# Patient Record
Sex: Male | Born: 1975 | Race: White | Hispanic: No | Marital: Married | Smoking: Current every day smoker
Health system: Southern US, Community
[De-identification: ages and names within clinical notes are randomized; demographics above are authoritative.]

## PROBLEM LIST (undated history)

## (undated) DIAGNOSIS — I10 Essential (primary) hypertension: Secondary | ICD-10-CM

## (undated) DIAGNOSIS — Z8719 Personal history of other diseases of the digestive system: Secondary | ICD-10-CM

## (undated) DIAGNOSIS — R5600 Simple febrile convulsions: Secondary | ICD-10-CM

## (undated) DIAGNOSIS — K219 Gastro-esophageal reflux disease without esophagitis: Secondary | ICD-10-CM

## (undated) DIAGNOSIS — J45909 Unspecified asthma, uncomplicated: Secondary | ICD-10-CM

## (undated) DIAGNOSIS — I219 Acute myocardial infarction, unspecified: Secondary | ICD-10-CM

## (undated) HISTORY — DX: Personal history of other diseases of the digestive system: Z87.19

## (undated) HISTORY — DX: Acute myocardial infarction, unspecified: I21.9

## (undated) HISTORY — DX: Essential (primary) hypertension: I10

## (undated) HISTORY — PX: TESTICLE SURGERY: SHX794

## (undated) HISTORY — DX: Gastro-esophageal reflux disease without esophagitis: K21.9

---

## 2015-06-30 ENCOUNTER — Encounter (HOSPITAL_COMMUNITY): Payer: Self-pay | Admitting: Emergency Medicine

## 2015-06-30 ENCOUNTER — Emergency Department (HOSPITAL_COMMUNITY)
Admission: EM | Admit: 2015-06-30 | Discharge: 2015-07-01 | Disposition: A | Discharge status: Home / Self Care | Payer: Self-pay | Attending: Emergency Medicine | Admitting: Emergency Medicine

## 2015-06-30 ENCOUNTER — Emergency Department (HOSPITAL_COMMUNITY): Payer: Self-pay

## 2015-06-30 DIAGNOSIS — R631 Polydipsia: Secondary | ICD-10-CM | POA: Insufficient documentation

## 2015-06-30 DIAGNOSIS — J45909 Unspecified asthma, uncomplicated: Secondary | ICD-10-CM | POA: Insufficient documentation

## 2015-06-30 DIAGNOSIS — R5383 Other fatigue: Secondary | ICD-10-CM | POA: Insufficient documentation

## 2015-06-30 DIAGNOSIS — H9213 Otorrhea, bilateral: Secondary | ICD-10-CM | POA: Insufficient documentation

## 2015-06-30 DIAGNOSIS — R42 Dizziness and giddiness: Secondary | ICD-10-CM | POA: Insufficient documentation

## 2015-06-30 DIAGNOSIS — F17200 Nicotine dependence, unspecified, uncomplicated: Secondary | ICD-10-CM | POA: Insufficient documentation

## 2015-06-30 DIAGNOSIS — IMO0001 Reserved for inherently not codable concepts without codable children: Secondary | ICD-10-CM

## 2015-06-30 DIAGNOSIS — R358 Other polyuria: Secondary | ICD-10-CM | POA: Insufficient documentation

## 2015-06-30 DIAGNOSIS — R030 Elevated blood-pressure reading, without diagnosis of hypertension: Secondary | ICD-10-CM | POA: Insufficient documentation

## 2015-06-30 DIAGNOSIS — R51 Headache: Secondary | ICD-10-CM | POA: Insufficient documentation

## 2015-06-30 DIAGNOSIS — H9203 Otalgia, bilateral: Secondary | ICD-10-CM | POA: Insufficient documentation

## 2015-06-30 HISTORY — DX: Unspecified asthma, uncomplicated: J45.909

## 2015-06-30 LAB — CBC WITH DIFFERENTIAL/PLATELET
Basophils Absolute: 0 10*3/uL (ref 0.0–0.1)
Basophils Relative: 0 %
Eosinophils Absolute: 0.4 10*3/uL (ref 0.0–0.7)
Eosinophils Relative: 3 %
HCT: 46.4 % (ref 39.0–52.0)
Hemoglobin: 15.4 g/dL (ref 13.0–17.0)
Lymphocytes Relative: 30 %
Lymphs Abs: 3.2 10*3/uL (ref 0.7–4.0)
MCH: 31 pg (ref 26.0–34.0)
MCHC: 33.2 g/dL (ref 30.0–36.0)
MCV: 93.5 fL (ref 78.0–100.0)
Monocytes Absolute: 0.9 10*3/uL (ref 0.1–1.0)
Monocytes Relative: 8 %
Neutro Abs: 6.4 10*3/uL (ref 1.7–7.7)
Neutrophils Relative %: 59 %
Platelets: 334 10*3/uL (ref 150–400)
RBC: 4.96 MIL/uL (ref 4.22–5.81)
RDW: 13.8 % (ref 11.5–15.5)
WBC: 10.8 10*3/uL — ABNORMAL HIGH (ref 4.0–10.5)

## 2015-06-30 LAB — BASIC METABOLIC PANEL
ANION GAP: 9 (ref 5–15)
BUN: 12 mg/dL (ref 6–20)
CHLORIDE: 107 mmol/L (ref 101–111)
CO2: 25 mmol/L (ref 22–32)
CREATININE: 0.97 mg/dL (ref 0.61–1.24)
Calcium: 9.2 mg/dL (ref 8.9–10.3)
GFR calc non Af Amer: >60 mL/min (ref >60)
Glucose, Bld: 117 mg/dL — ABNORMAL HIGH (ref 65–99)
POTASSIUM: 4.2 mmol/L (ref 3.5–5.1)
SODIUM: 141 mmol/L (ref 135–145)

## 2015-06-30 LAB — URINALYSIS, ROUTINE W REFLEX MICROSCOPIC
Bilirubin Urine: NEGATIVE
GLUCOSE, UA: NEGATIVE mg/dL
Ketones, ur: NEGATIVE mg/dL
LEUKOCYTES UA: NEGATIVE
NITRITE: NEGATIVE
PH: 5 (ref 5.0–8.0)
Protein, ur: NEGATIVE mg/dL
Specific Gravity, Urine: 1.027 (ref 1.005–1.030)

## 2015-06-30 LAB — URINE MICROSCOPIC-ADD ON: WBC UA: NONE SEEN WBC/hpf (ref 0–5)

## 2015-06-30 LAB — CBG MONITORING, ED: Glucose-Capillary: 106 mg/dL — ABNORMAL HIGH (ref 65–99)

## 2015-06-30 LAB — I-STAT TROPONIN, ED: Troponin i, poc: 0 ng/mL (ref 0.00–0.08)

## 2015-06-30 MED ORDER — SODIUM CHLORIDE 0.9 % IV BOLUS (SEPSIS)
1000.0000 mL | Freq: Once | INTRAVENOUS | Status: AC
  Administered 2015-06-30: 1000 mL via INTRAVENOUS

## 2015-06-30 NOTE — ED Provider Notes (Signed)
CSN: [id number]     Arrival date & time 06/30/15  2057 History  By signing my name below, I, [facility], attest that this documentation has been prepared under the direction and in the presence of [employer]ole Diontae Route, PA-C. Electronically Signed: Randell PatientMarrissa Washington, ED Scribe. 06/30/2015. 11:06 PM.    Chief Complaint  Patient presents with  . Otalgia   The history is provided by the patient. No language interpreter was used.   HPI Comments: Walter PurpuraDavid M Woods is a 39 y.o. male with an hx of asthma who presents to the Emergency Department complaining of exacerbation of chronic frontal migraine, dizziness, lightheadness, and bilateral ear drainage onset 3 days ago. He describes the drainage from his ears as yellow-clearish fluid. Patient endorses polyuria and polydipsia for the past 3 days and a dry cough that has been present for several months. Per patient, dizziness is worsened by standing up and states that he feels off-balance. He states that he has been drinking more fluids but is still thirsty. He denies a family hx of DM. He denies CP different from baseline secondary to cough, fever, and SOB.  Past Medical History  Diagnosis Date  . Asthma    No past surgical history on file. No family history on file. Social History  Substance Use Topics  . Smoking status: Current Every Day Smoker  . Smokeless tobacco: None  . Alcohol Use: Yes    Review of Systems A complete 10 system review of systems was obtained and all systems are negative except as noted in the HPI and PMH.    Allergies  Phenobarbital  Home Medications   Prior to Admission medications   Not on File   BP 154/96 mmHg  Pulse 90  Temp(Src) 97.5 F (36.4 C) (Oral)  Resp 18  Ht 5\' 9"  (1.753 m)  Wt 247 lb (112.038 kg)  BMI 36.46 kg/m2  SpO2 99% Physical Exam  Constitutional: He is oriented to person, place, and time. He appears well-developed and well-nourished.  HENT:  Head: Normocephalic and atraumatic.   Mouth/Throat: Oropharynx is clear and moist.  No drooling or stridor. Posterior pharynx mildly erythematous no significant tonsillar hypertrophy. No exudate. Soft palate rises symmetrically. No TTP or induration under tongue.   No tenderness to palpation of frontal or bilateral maxillary sinuses.  No mucosal edema in the nares.  Bilateral tympanic membranes with normal architecture and good light reflex.    Eyes: Conjunctivae and EOM are normal. Pupils are equal, round, and reactive to light.  No TTP of maxillary or frontal sinuses  No TTP or induration of temporal arteries bilaterally  Neck: Normal range of motion. Neck supple.  FROM to C-spine. Pt can touch chin to chest without discomfort. No TTP of midline cervical spine.   Cardiovascular: Normal rate, regular rhythm and intact distal pulses.   Pulmonary/Chest: Effort normal and breath sounds normal. No respiratory distress. He has no wheezes. He has no rales. He exhibits no tenderness.  Abdominal: Soft. Bowel sounds are normal. There is no tenderness.  Musculoskeletal: Normal range of motion. He exhibits no edema or tenderness.  Neurological: He is alert and oriented to person, place, and time. No cranial nerve deficit.  II-Visual fields grossly intact. III/IV/VI-Extraocular movements intact.  Pupils reactive bilaterally. V/VII-Smile symmetric, equal eyebrow raise,  facial sensation intact VIII- Hearing grossly intact IX/X-Normal gag XI-bilateral shoulder shrug XII-midline tongue extension Motor: 5/5 bilaterally with normal tone and bulk Cerebellar: Normal finger-to-nose  and normal heel-to-shin test.   Romberg negative  Ambulates with a coordinated gait   Nursing note and vitals reviewed.   ED Course  Procedures  DIAGNOSTIC STUDIES: Oxygen Saturation is 99% on RA, normal by my interpretation.    COORDINATION OF CARE: 10:08 PM Discussed treatment plan with pt at bedside and pt agreed to plan.  11:07 PM Ordered EKG  and labs for pt.  Labs Review Labs Reviewed  CBC WITH DIFFERENTIAL/PLATELET - Abnormal; Notable for the following:    WBC 10.8 (*)    All other components within normal limits  URINALYSIS, ROUTINE W REFLEX MICROSCOPIC (NOT AT [facility])  BASIC METABOLIC PANEL  I-STAT TROPOININ, ED  CBG MONITORING, ED    Imaging Review Dg Chest 2 View  06/30/2015  CLINICAL DATA:  Cough for 1 month EXAM: CHEST - 2 VIEW COMPARISON:  06/10/2013 FINDINGS: The heart size and mediastinal contours are within normal limits. Both lungs are clear. The visualized skeletal structures are unremarkable. IMPRESSION: No active disease. Electronically Signed   By: Alcide Clever M.D.   On: 06/30/2015 22:56   I have personally reviewed and evaluated these images and lab results as part of my medical decision-making.   EKG Interpretation None      MDM   Final diagnoses:  None    Filed Vitals:   06/30/15 2121 07/01/15 0039  BP: 154/96 152/88  Pulse: 90 74  Temp: 97.5 F (36.4 C) 97.7 F (36.5 C)  TempSrc: Oral Oral  Resp: 18 18  Height:  (1.753 m)   Weight: 112.038 kg   SpO2: 99% 98%    Medications  sodium chloride 0.9 % bolus 1,000 mL (1,000 mLs Intravenous Pending 06/30/15 2355)    Walter Woods is 39 y.o. male presenting with lightheaded sensation, polyuria, polydipsia, bilateral ear drainage and frontal headache. Patient is reporting polyuria and polydipsia and lightheaded sensation, blood glucose is normal, chemistry reassuring, urinalysis without glucose. EKG with no arrhythmia. Troponin negative. Chest x-ray negative (patient has cough). Presentation is like pts typical HA and non concerning for [facility], ICH, Meningitis, or temporal arteritis. Pt is afebrile with no focal neuro deficits, nuchal rigidity, or change in vision. Patient is also reporting bilateral ear drainage, no abnormality in my physical exam. Encouraged patient to establish primary care and return to ED for any new or worsening  symptoms  Evaluation does not show pathology that would require ongoing emergent intervention or inpatient treatment. Pt is hemodynamically stable and mentating appropriately. Discussed findings and plan with patient/guardian, who agrees with care plan. All questions answered. Return precautions discussed and outpatient follow up given.    I personally performed the services described in this documentation, which was scribed in my presence. The recorded information has been reviewed and is accurate.    Wynetta Emery, PA-C 07/01/15 0113  Eber Hong, MD 07/04/15 2200

## 2015-06-30 NOTE — ED Notes (Signed)
Pt. reports bilateral ear ache with drainage for 1 month , denies injury , no fever or chills , no hearing loss.

## 2015-06-30 NOTE — ED Notes (Signed)
Gave pt Cola, per Selena BattenKim - RN

## 2015-06-30 NOTE — ED Notes (Signed)
Pt's CBG result was 106. Informed Kim - RN.

## 2015-07-01 NOTE — Discharge Instructions (Signed)
Do not hesitate to return to the emergency room for any new, worsening or concerning symptoms. ° °Please obtain primary care using resource guide below. Let them know that you were seen in the emergency room and that they will need to obtain records for further outpatient management. ° ° ° °Emergency Department Resource Guide °1) Find a Doctor and Pay Out of Pocket °Although you won't have to find out who is covered by your insurance plan, it is a good idea to ask around and get recommendations. You will then need to call the office and see if the doctor you have chosen will accept you as a new patient and what types of options they offer for patients who are self-pay. Some doctors offer discounts or will set up payment plans for their patients who do not have insurance, but you will need to ask so you aren't surprised when you get to your appointment. ° °2) Contact Your Local Health Department °Not all health departments have doctors that can see patients for sick visits, but many do, so it is worth a call to see if yours does. If you don't know where your local health department is, you can check in your phone book. The CDC also has a tool to help you locate your state's health department, and many state websites also have listings of all of their local health departments. ° °3) Find a Walk-in Clinic °If your illness is not likely to be very severe or complicated, you may want to try a walk in clinic. These are popping up all over the country in pharmacies, drugstores, and shopping centers. They're usually staffed by nurse practitioners or physician assistants that have been trained to treat common illnesses and complaints. They're usually fairly quick and inexpensive. However, if you have serious medical issues or chronic medical problems, these are probably not your best option. ° °No Primary Care Doctor: °- Call Health Connect at  832-8000 - they can help you locate a primary care doctor that  accepts your  insurance, provides certain services, etc. °- Physician Referral Service- 1-800-533-3463 ° °Chronic Pain Problems: °Organization         Address  Phone   Notes  °[facility]  (336) 297-2271 Patients need to be referred by their primary care doctor.  ° °Medication Assistance: °Organization         Address  Phone   Notes  °[place] [facility] [address] °[place], [place] [address] (336) 641-8030 --Must be a resident of [place] °-- Must have NO insurance coverage whatsoever (no Medicaid/ Medicare, etc.) °-- The pt. MUST have a primary care doctor that directs their care regularly and follows them in the community °  °[facility]  (866) 331-1348   °[employer]  (888) 892-1162   ° °Agencies that provide inexpensive medical care: °Organization         Address  Phone   Notes  °[facility]  (336) 832-8035   °[facility]    (336) 832-7272   °[facility] [address] °[place], [place] [address] (336) 832-4777   °[facility] [address], °[place] (336) 271-4999   °[facility]    (336) 373-0678   °[facility]    (336) 272-1050   °[facility] ° [address],  Phone:  (336) 832-4444, Fax:  (336) 832-4440 Hours of Operation:  9 am -   6 pm, M-F.  Also accepts Medicaid/Medicare and self-pay.  °[facility] ° [address], [place] Phone: (336) 832-3150, Fax: (336) 832-3151. Hours of Operation:  8:30 am - 5:30 pm, M-F.  Also accepts Medicaid and self-pay.  °[facility] [place] [address], [place] Phone: (336) 878-6027   °[facility] [address], [place], [place] (336)723-1848, Ext. 123 Mondays & Thursdays: 7-9 AM.  First 15 patients are seen on a first come, first serve basis. °  ° °Medicaid-accepting [place] Providers: ° °Organization          Address  Phone   Notes  °[facility] [address], [place] (336) 641-2100 Also accepts self-pay patients.  °[facility] [address], [place] ° (336) 856-9996   °[facility] [address], [place] (336) 288-8857   °[facility] [address] [place] [address], [place] (336) 299-7000   °Veita Bland [address], [place]  ° (336) 373-1557 Only accepts [place] Access Medicaid patients after they have their name applied to their card.  ° °Self-Pay (no insurance) in [place]: ° °Organization         Address  Phone   Notes  °Sickle Cell Patients, [facility] [address], [place] (336) 832-1970   °[facility] [address], [place] (336) 832-4400   ° [facility] ° [address], [place] (336) 992-4800   °[facility]/Dr. Osei-Bonsu ° [address] [place] [address], [place] or [address], [place] (336) 841-8500 Phone number for both [place] and [place] locations is the same.  °[facility] [address], [place] (336) 299-0000   °[facility] [address] [place] [address], [place] or [address] (336) 852-7530 °(336) 878-2260   °[facility] [address], [place] (336) 350-1642, phone; (336) 294-5005, fax Sees patients 1st and 3rd Saturday of every month.  Must not qualify for public or private insurance (i.e. Medicaid, Medicare, [place] Health Choice, Veterans' Benefits) • Household income should be no more than 200% of the poverty level •The clinic cannot treat you if you are pregnant or think you are pregnant • Sexually transmitted diseases are not treated at the clinic.  ° ° °Dental Care: °Organization         Address  Phone  Notes  °[facility] [facility] [address],  [place] (336) 641-6152 Accepts children up to age 21 who are enrolled in Medicaid or [place] Health Choice; pregnant women with a Medicaid card; and children who have applied for Medicaid or [place] Health Choice, but were declined, whose parents can pay a reduced fee at time of service.  °[facility] [place]  [address], [place] (336) 641-7733 Accepts children up to age 21 who are enrolled in Medicaid or [place] Health Choice; pregnant women with a Medicaid card; and children who have applied for Medicaid or [place] Health Choice, but were declined, whose parents can pay a reduced fee at time of service.  °[facility] ° [address],  (336) 641-4533 Patients are seen by appointment only. Walk-ins are not accepted. [facility] will see patients 18 years of age and   older. °Monday - Tuesday (8am-5pm) °Most Wednesdays (8:30-5pm) °$30 per visit, cash only  °[facility] ° [address], [place] (336) 641-4533 Patients are seen by appointment only. Walk-ins are not accepted. [facility] will see patients 18 years of age and older. °One Wednesday Evening (Monthly: Volunteer Based).  $30 per visit, cash only  °[facility]  (919) 537-3737 for adults; Children under age 4, call Graduate Pediatric Dentistry at (919) [phone]. Children aged 4-14, please call (919) 537-3737 to request a pediatric application. ° Dental services are provided in all areas of dental care including fillings, crowns and bridges, complete and partial dentures, implants, gum treatment, root canals, and extractions. Preventive care is also provided. Treatment is provided to both adults and children. °Patients are selected via a lottery and there is often a waiting list. °  °[facility] [address], °[place] ° (336) 763-8833 [email or web address] °  °[facility] [address], [place], [place]  (336)723-1848, Ext. 123 Second and Fourth Thursday of each month, opens at 6:30 AM; Clinic ends at 9 AM.  Patients are seen on a first-come first-served basis, and a limited number are seen during each clinic.  ° °[facility] ° [address], [place], [place] (336) 723-7904   Eligibility Requirements °You must have lived in [place], [place], or [place] counties for at least the last three months. °  You cannot be eligible for state or federal sponsored healthcare insurance, including Veterans Administration, Medicaid, or Medicare. °  You generally cannot be eligible for healthcare insurance through your employer.  °  How to apply: °Eligibility screenings are held every Tuesday and Wednesday afternoon from 1:00 pm until 4:00 pm. You do not need an appointment for the interview!  °[facility] [address], [place], [place] 336-631-2330   °[facility] [facility]  336-342-8273   °[facility]  336-703-3100   °[facility]  336-570-6415   ° °Behavioral Health Resources in the Community: °Intensive Outpatient Programs °Organization         Address  Phone  Notes  °[facility] [address], [place], [place] 336-878-6098   °[facility] [address], [place], [place] 336-832-9800   °[facility] [address], [place], [place] ° 336-882-2125   °[facility] [address],  °[place], [place] 1-800-853-5163 or 336-641-4981   °Substance Abuse Resources °Organization         Address  Phone  Notes  °[facility]  336-882-2125   °[facility]  336-784-9470   °[facility]  336-285-9073   °[facility]  336-845-3988   °Residential & Outpatient Substance Abuse Program  1-800-659-3381   °Psychological Services °Organization         Address  Phone  Notes  °[facility]  336- 832-9600   °[facility]  336- 378-7881    °[facility] [address], [place] 1-800-853-5163 or 336-641-4981   ° °Mobile Crisis Teams °Organization         Address  Phone  Notes  °[facility], Mobile Crisis Care Unit  1-877-626-1772   °Assertive °[facility] ° [address] [place], [place] 336-834-9664   °Sharon DeEsch [address] °[place] 336-554-5454   ° °Self-Help/Support Groups °Organization         Address    Phone             Notes  °[facility] [place] [facility] - variety of support groups  336- 373-1402 Call for more information  °Narcotics Anonymous (NA), [facility] [address], °[place]  2 meetings at this location  ° °Residential Treatment Programs °Organization         Address  Phone  Notes  °[facility] [address],    °[place]  1-866-801-8205   °[facility] ° [address], [place], [place] 704-293-8524   °[facility] [address], [place] 336-845-3988 Admissions: 8am-3pm M-F  °[facility] [address]    °[place], [place] 336-841-1104   °[facility] [address], [place], [place] 336-379-7146   °[facility] [address]  °[place], [place] 336-285-9073   °[facility] Intensive Outpatient [address], [place], [place] 336-852-3033   °[facility] [address]  °[place], [place] 1-877-615-2722 or 336-784-9470   °[facility] [address] [place], [place] 336-227-7417 Accepts Medicaid  °[facility] [address]  °[place] 1-800-659-3381 Substance Abuse/Addiction Treatment  ° °[place] Behavioral Health Resources °Organization         Address  Phone  Notes  °[facility]  (888) 581-9988   °Julie Brannon, PhD [address] [place], [place]   (336) 349-5553 or (336) 951-0000   °[facility]   [address] °[place], [place] (336) 349-4454   °[facility] [address], [place], [place] (336) 342-8316 Insurance/Medicaid/sponsorship through [facility]  °[facility] [address]                                    [place], [place] (336) 342-8316 Therapy/tele-psych/case  °[facility] [address]  ° [place], [place] (336) 349-2233    °Dr. Arfeen  (336) 349-4544   °[facility] [place]  [employer] [place] [facility] 1) [address], [place] °2) [address], [place] °3)  [address], [place] (336) 349-3220 °(336) 342-7768 ° °(336) 342-8140   °[place] [facility] Abuse [facility] (336) 342-1394 or (336) 342-3537 (After Hours)    ° ° ° °

## 2015-08-18 ENCOUNTER — Encounter (HOSPITAL_COMMUNITY): Payer: Self-pay | Admitting: Emergency Medicine

## 2015-08-18 ENCOUNTER — Emergency Department (HOSPITAL_COMMUNITY): Payer: Self-pay

## 2015-08-18 ENCOUNTER — Emergency Department (HOSPITAL_COMMUNITY)
Admission: EM | Admit: 2015-08-18 | Discharge: 2015-08-18 | Disposition: A | Discharge status: Home / Self Care | Payer: Self-pay | Attending: Emergency Medicine | Admitting: Emergency Medicine

## 2015-08-18 DIAGNOSIS — F17200 Nicotine dependence, unspecified, uncomplicated: Secondary | ICD-10-CM | POA: Insufficient documentation

## 2015-08-18 DIAGNOSIS — Y998 Other external cause status: Secondary | ICD-10-CM | POA: Insufficient documentation

## 2015-08-18 DIAGNOSIS — X58XXXA Exposure to other specified factors, initial encounter: Secondary | ICD-10-CM | POA: Insufficient documentation

## 2015-08-18 DIAGNOSIS — S46911A Strain of unspecified muscle, fascia and tendon at shoulder and upper arm level, right arm, initial encounter: Secondary | ICD-10-CM | POA: Insufficient documentation

## 2015-08-18 DIAGNOSIS — Y9389 Activity, other specified: Secondary | ICD-10-CM | POA: Insufficient documentation

## 2015-08-18 DIAGNOSIS — J45909 Unspecified asthma, uncomplicated: Secondary | ICD-10-CM | POA: Insufficient documentation

## 2015-08-18 DIAGNOSIS — Y9289 Other specified places as the place of occurrence of the external cause: Secondary | ICD-10-CM | POA: Insufficient documentation

## 2015-08-18 HISTORY — DX: Simple febrile convulsions: R56.00

## 2015-08-18 MED ORDER — IBUPROFEN 800 MG PO TABS: 800.0000 mg | ORAL_TABLET | Freq: Three times a day (TID) | ORAL | Status: AC

## 2015-08-18 NOTE — ED Notes (Signed)
Pt states that he has had R shoulder pain and has heard popping in that area. Lifts furniture for a living. Alert and oriented.

## 2015-08-18 NOTE — ED Notes (Signed)
Patient transported to X-ray 

## 2015-08-18 NOTE — ED Provider Notes (Signed)
CSN: [id number]     Arrival date & time 08/18/15  2115 History  By signing my name below, I, Octavia Heir, attest that this documentation has been prepared under the direction and in the presence of Elpidio Anis, PA-C. Electronically Signed: Octavia Heir, ED Scribe. 08/18/2015. 10:12 PM.    Chief Complaint  Patient presents with  . Shoulder Pain      The history is provided by the patient. No language interpreter was used.   HPI Comments: Walter Woods is a 40 y.o. male who presents to the Emergency Department complaining of constant, gradual worsening right shoulder pain that radiates up the right side of his neck onset one week ago. Pt was at work when he was moving a couch when he felt a "pop" in his right shoulder last week. He reports moving another cough today and felt another "pop" and had sharp shooting pain that radiated up the right side of his neck. He reports he is unable to lift his arm fully due to increased pain. Pt has not taken any medication to alleviate the pain. Denies chest pain.   Past Medical History  Diagnosis Date  . Asthma   . Febrile seizures (HCC)     when child   Past Surgical History  Procedure Laterality Date  . Testicle surgery      cyst removed    History reviewed. No pertinent family history. Social History  Substance Use Topics  . Smoking status: Current Every Day Smoker  . Smokeless tobacco: None  . Alcohol Use: Yes    Review of Systems  Cardiovascular: Negative for chest pain.  Musculoskeletal: Positive for arthralgias.  All other systems reviewed and are negative.     Allergies  Phenobarbital  Home Medications   Prior to Admission medications   Not on File   Triage vitals: BP 145/91 mmHg  Pulse 90  Temp(Src) 97.5 F (36.4 C) (Oral)  Resp 18  SpO2 100% Physical Exam  Constitutional: He is oriented to person, place, and time. He appears well-developed and well-nourished. No distress.  HENT:  Head: Normocephalic and  atraumatic.  Eyes: Right eye exhibits no discharge. Left eye exhibits no discharge.  Pulmonary/Chest: Effort normal. No respiratory distress.  Musculoskeletal:  Right shoulder tender along superior border without swelling or discoloration. FROM. NO midline cervical tenderness. Full right grip strength.   Neurological: He is alert and oriented to person, place, and time. Coordination normal.  Skin: No rash noted. He is not diaphoretic.  Psychiatric: He has a normal mood and affect. His behavior is normal.  Nursing note and vitals reviewed.   ED Course  Procedures  DIAGNOSTIC STUDIES: Oxygen Saturation is 100% on RA, normal by my interpretation.  COORDINATION OF CARE:  10:06 PM Discussed treatment plan with pt at bedside and pt agreed to plan.  Labs Review Labs Reviewed - No data to display  Imaging Review No results found. I have personally reviewed and evaluated these images and lab results as part of my medical decision-making.   EKG Interpretation None      MDM   Final diagnoses:  None    1. Right shoulder strain  Initial shoulder x-ray showed questionable lung nodule, and recommendation for CXR. Discussed with patient who requested the chest x-ray be done now as he would not be able to follow up in a timely manner. He reports concern as both parents have lung CA and patient continues to smoke. CXR done and is negative.   Shoulder  strain requires supportive measures and ortho follow up with any persistent symptoms.   I personally performed the services described in this documentation, which was scribed in my presence. The recorded information has been reviewed and is accurate.     Elpidio Anis, PA-C 08/20/15 1610  Alvira Monday, MD 08/24/15 2238

## 2015-08-18 NOTE — Discharge Instructions (Signed)
Cryotherapy °Cryotherapy means treatment with cold. Ice or gel packs can be used to reduce both pain and swelling. Ice is the most helpful within the first 24 to 48 hours after an injury or flare-up from overusing a muscle or joint. Sprains, strains, spasms, burning pain, shooting pain, and aches can all be eased with ice. Ice can also be used when recovering from surgery. Ice is effective, has very few side effects, and is safe for most people to use. °PRECAUTIONS  °Ice is not a safe treatment option for people with: °· Raynaud phenomenon. This is a condition affecting small blood vessels in the extremities. Exposure to cold may cause your problems to return. °· Cold hypersensitivity. There are many forms of cold hypersensitivity, including: °· Cold urticaria. Red, itchy hives appear on the skin when the tissues begin to warm after being iced. °· Cold erythema. This is a red, itchy rash caused by exposure to cold. °· Cold hemoglobinuria. Red blood cells break down when the tissues begin to warm after being iced. The hemoglobin that carry oxygen are passed into the urine because they cannot combine with blood proteins fast enough. °· Numbness or altered sensitivity in the area being iced. °If you have any of the following conditions, do not use ice until you have discussed cryotherapy with your caregiver: °· Heart conditions, such as arrhythmia, angina, or chronic heart disease. °· High blood pressure. °· Healing wounds or open skin in the area being iced. °· Current infections. °· Rheumatoid arthritis. °· Poor circulation. °· Diabetes. °Ice slows the blood flow in the region it is applied. This is beneficial when trying to stop inflamed tissues from spreading irritating chemicals to surrounding tissues. However, if you expose your skin to cold temperatures for too long or without the proper protection, you can damage your skin or nerves. Watch for signs of skin damage due to cold. °HOME CARE INSTRUCTIONS °Follow  these tips to use ice and cold packs safely. °· Place a dry or damp towel between the ice and skin. A damp towel will cool the skin more quickly, so you may need to shorten the time that the ice is used. °· For a more rapid response, add gentle compression to the ice. °· Ice for no more than 10 to 20 minutes at a time. The bonier the area you are icing, the less time it will take to get the benefits of ice. °· Check your skin after 5 minutes to make sure there are no signs of a poor response to cold or skin damage. °· Rest 20 minutes or more between uses. °· Once your skin is numb, you can end your treatment. You can test numbness by very lightly touching your skin. The touch should be so light that you do not see the skin dimple from the pressure of your fingertip. When using ice, most people will feel these normal sensations in this order: cold, burning, aching, and numbness. °· Do not use ice on someone who cannot communicate their responses to pain, such as small children or people with dementia. °HOW TO MAKE AN ICE PACK °Ice packs are the most common way to use ice therapy. Other methods include ice massage, ice baths, and cryosprays. Muscle creams that cause a cold, tingly feeling do not offer the same benefits that ice offers and should not be used as a substitute unless recommended by your caregiver. °To make an ice pack, do one of the following: °· Place crushed ice or a   bag of frozen vegetables in a sealable plastic bag. Squeeze out the excess air. Place this bag inside another plastic bag. Slide the bag into a pillowcase or place a damp towel between your skin and the bag.  Mix 3 parts water with 1 part rubbing alcohol. Freeze the mixture in a sealable plastic bag. When you remove the mixture from the freezer, it will be slushy. Squeeze out the excess air. Place this bag inside another plastic bag. Slide the bag into a pillowcase or place a damp towel between your skin and the bag. SEEK MEDICAL CARE  IF:  You develop white spots on your skin. This may give the skin a blotchy (mottled) appearance.  Your skin turns blue or pale.  Your skin becomes waxy or hard.  Your swelling gets worse. MAKE SURE YOU:   Understand these instructions.  Will watch your condition.  Will get help right away if you are not doing well or get worse.   This information is not intended to replace advice given to you by your health care provider. Make sure you discuss any questions you have with your health care provider.   Document Released: 02/14/2011 Document Revised: 07/11/2014 Document Reviewed: 02/14/2011 Elsevier Interactive Patient Education 2016 [employer] Shoulder Sprain A shoulder sprain is a partial or complete tear in one of the tough, fiber-like tissues (ligaments) in the shoulder. The ligaments in the shoulder help to hold the shoulder in place. CAUSES This condition may be caused by:  A fall.  A hit to the shoulder.  A twist of the arm. RISK FACTORS This condition is more likely to develop in:  People who play sports.  People who have problems with balance or coordination. SYMPTOMS Symptoms of this condition include:  Pain when moving the shoulder.  Limited ability to move the shoulder.  Swelling and tenderness on top of the shoulder.  Warmth in the shoulder.  A change in the shape of the shoulder.  Redness or bruising on the shoulder. DIAGNOSIS This condition is diagnosed with a physical exam. During the exam, you may be asked to do simple exercises with your shoulder. You may also have imaging tests, such as X-rays, MRI, or a CT scan. These tests can show how severe the sprain is. TREATMENT This condition may be treated with:  Rest.  Pain medicine.  Ice.  A sling or brace. This is used to keep the arm still while the shoulder is healing.  Physical therapy or rehabilitation exercises. These help to improve the range of motion and strength of the  shoulder.  Surgery (rare). Surgery may be needed if the sprain caused a joint to become unstable. Surgery may also be needed to reduce pain. Some people may develop ongoing shoulder pain or lose some range of motion in the shoulder. However, most people do not develop long-term problems. HOME CARE INSTRUCTIONS  Rest.  Take over-the-counter and prescription medicines only as told by your health care provider.  If directed, apply ice to the area:  Put ice in a plastic bag.  Place a towel between your skin and the bag.  Leave the ice on for 20 minutes, 2-3 times per day.  If you were given a shoulder sling or brace:  Wear it as told.  Remove it to shower or bathe.  Move your arm only as much as told by your health care provider, but keep your hand moving to prevent swelling.  If you were shown how to do any exercises,  do them as told by your health care provider.  Keep all follow-up visits as told by your health care provider. This is important. SEEK MEDICAL CARE IF:  Your pain gets worse.  Your pain is not relieved with medicines.  You have increased redness or swelling. SEEK IMMEDIATE MEDICAL CARE IF:  You have a fever.  You cannot move your arm or shoulder.  You develop numbness or tingling in your arms, hands, or fingers.   This information is not intended to replace advice given to you by your health care provider. Make sure you discuss any questions you have with your health care provider.   Document Released: 11/06/2008 Document Revised: 03/11/2015 Document Reviewed: 10/13/2014 Elsevier Interactive Patient Education [employer]

## 2017-06-05 IMAGING — CR DG SHOULDER 2+V*R*
3 series · 3 of 3 positions shown · non-contrast
Comparison: None.

CLINICAL DATA: Right shoulder pain for 1 day

EXAM:
RIGHT SHOULDER - 2+ VIEW

[w shoulder external right]
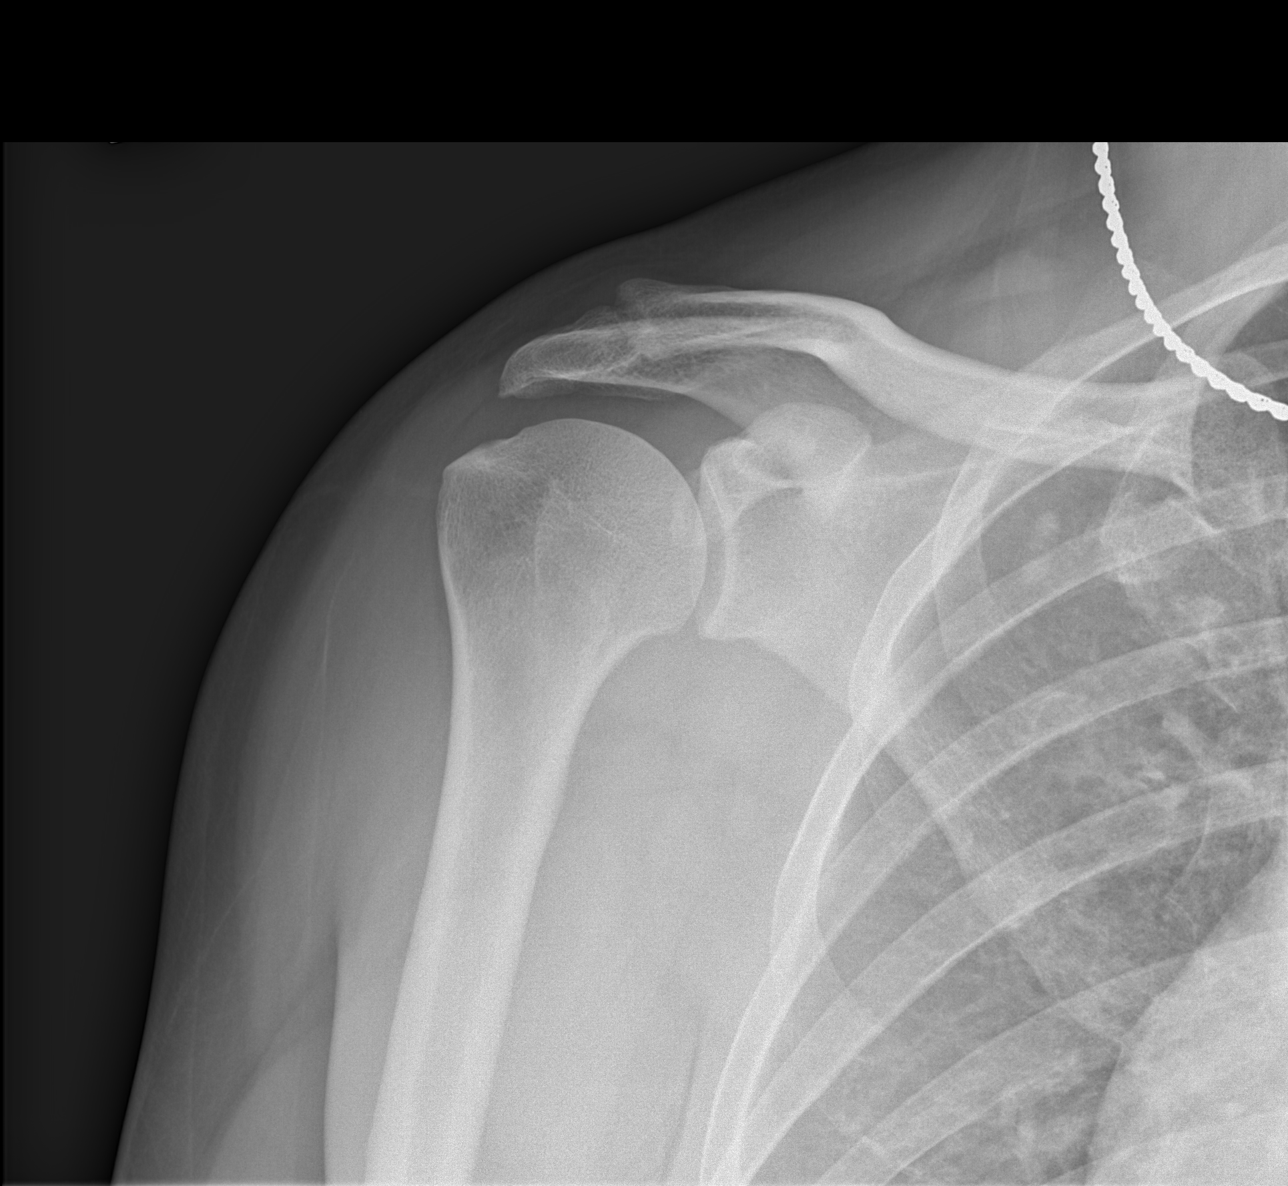

[w shoulder y-view right]
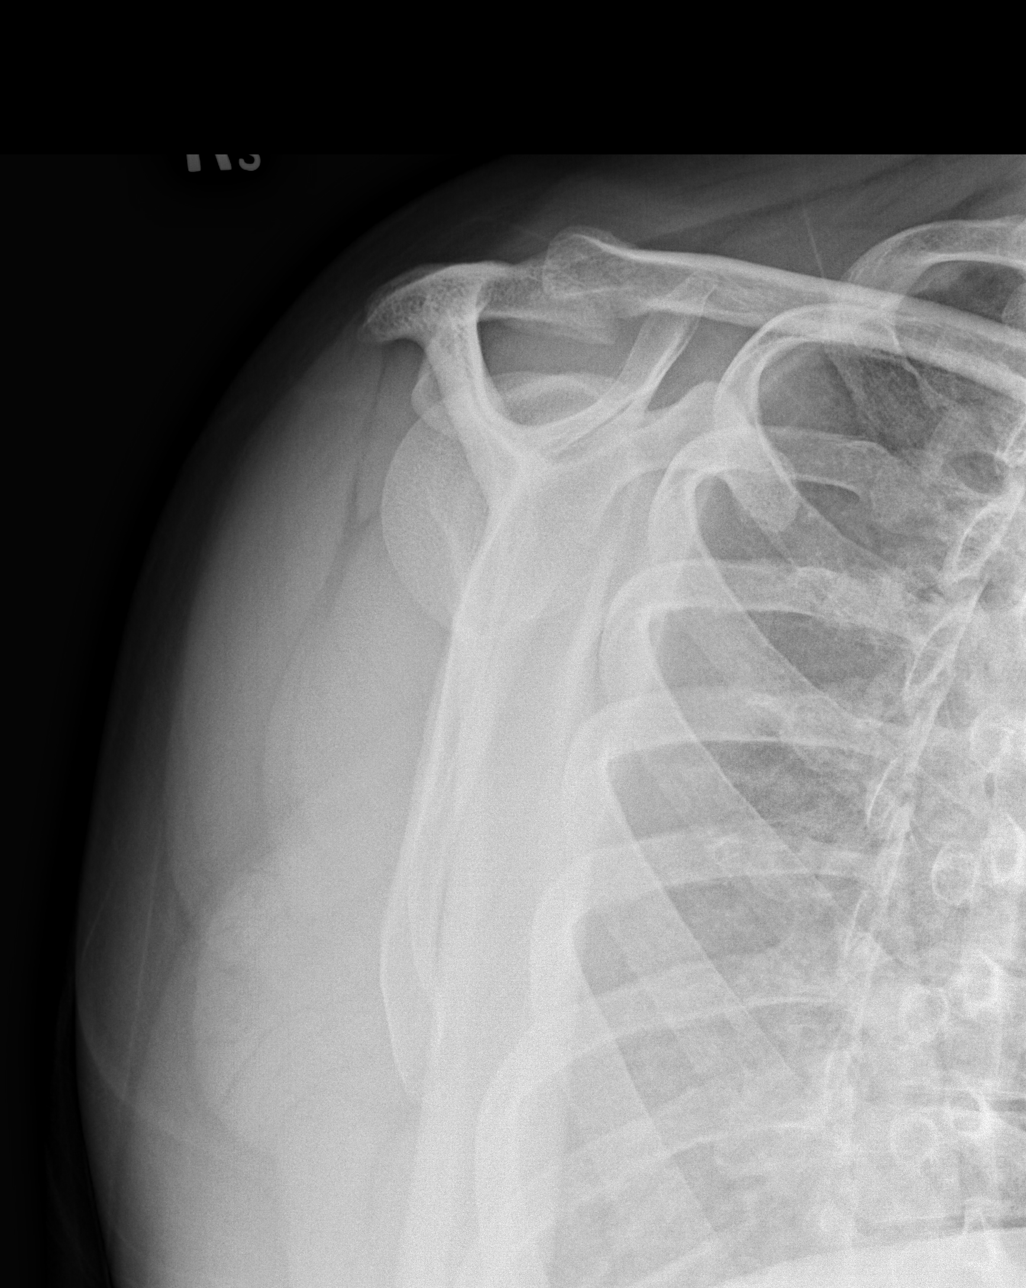

[x shoulder axillary right]
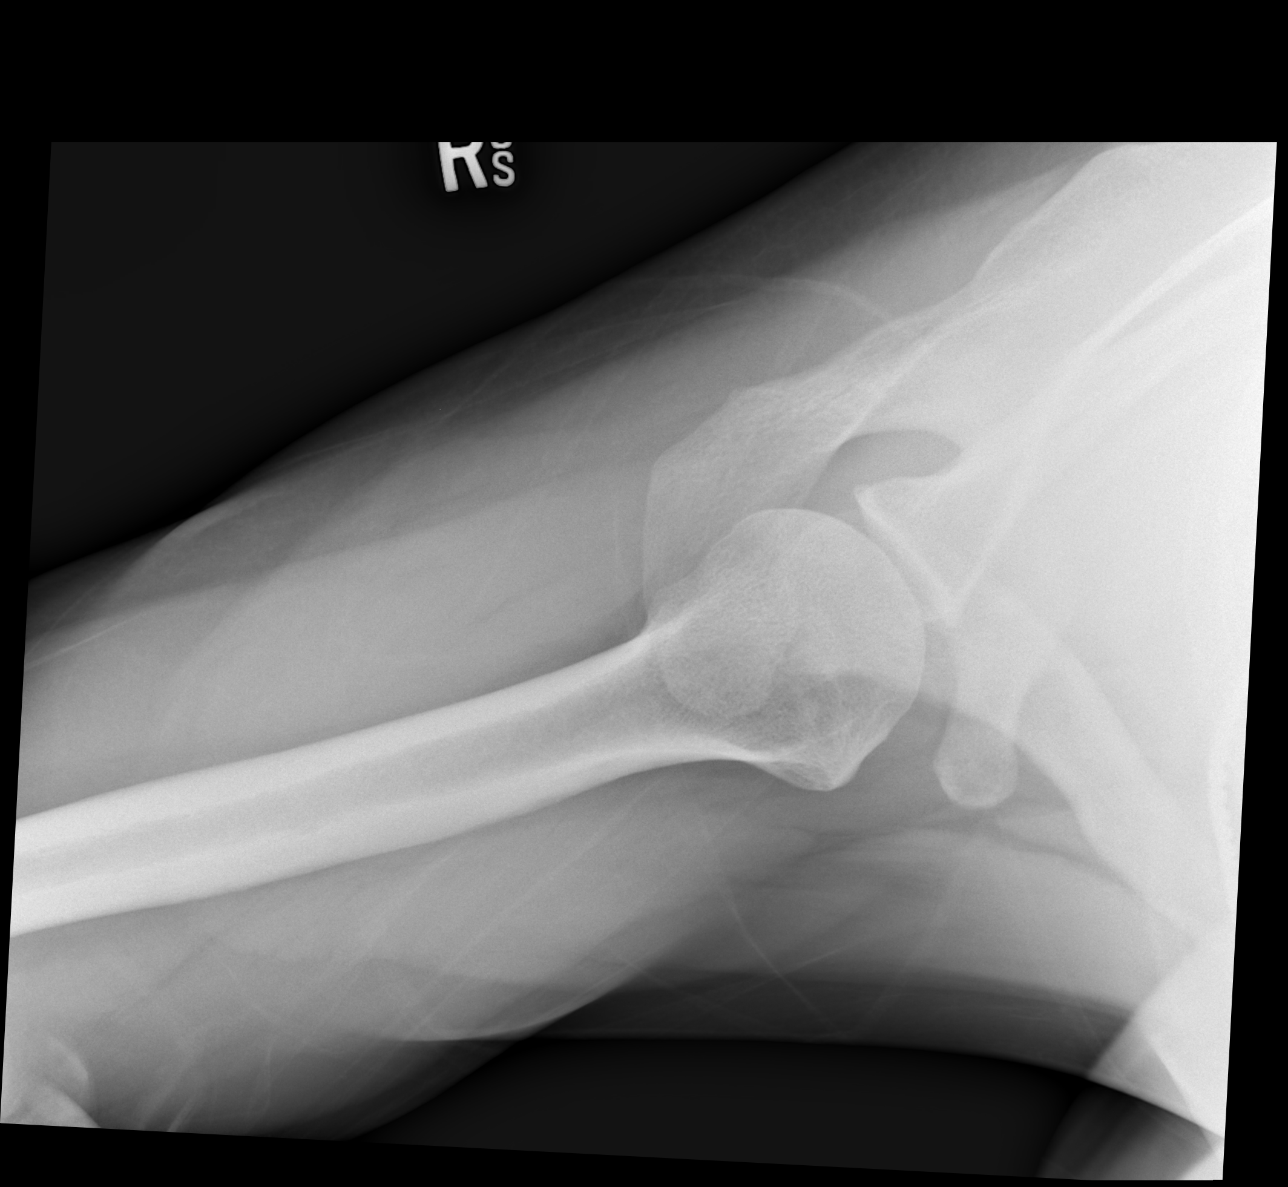

[3 of 3 positions shown; findings below may reference images not displayed]

FINDINGS: No acute fracture. No dislocation. Ovoid nodular density projects
over the right upper lung zone.
IMPRESSION: No acute bony pathology.

Possible lung nodule.  Chest x-rays recommended.

## 2019-03-15 DIAGNOSIS — K631 Perforation of intestine (nontraumatic): Secondary | ICD-10-CM

## 2019-03-15 DIAGNOSIS — I10 Essential (primary) hypertension: Secondary | ICD-10-CM

## 2019-03-15 DIAGNOSIS — K529 Noninfective gastroenteritis and colitis, unspecified: Secondary | ICD-10-CM

## 2019-03-15 DIAGNOSIS — K5792 Diverticulitis of intestine, part unspecified, without perforation or abscess without bleeding: Secondary | ICD-10-CM

## 2019-08-06 DIAGNOSIS — D72829 Elevated white blood cell count, unspecified: Secondary | ICD-10-CM

## 2019-08-06 HISTORY — DX: Elevated white blood cell count, unspecified: D72.829

## 2020-05-06 ENCOUNTER — Telehealth: Payer: Self-pay | Admitting: Oncology

## 2020-05-06 NOTE — Telephone Encounter (Signed)
Scheduled appointment per 11/2 new patient referral. Called patient, no answer. Left message for patient with appointment date and time and instructions to call back to verify.

## 2020-05-11 ENCOUNTER — Other Ambulatory Visit: Payer: Self-pay

## 2020-05-11 ENCOUNTER — Ambulatory Visit: Payer: Self-pay | Admitting: Oncology

## 2020-05-18 ENCOUNTER — Other Ambulatory Visit: Payer: Self-pay | Admitting: Oncology

## 2020-05-18 ENCOUNTER — Inpatient Hospital Stay (INDEPENDENT_AMBULATORY_CARE_PROVIDER_SITE_OTHER): Payer: BC Managed Care – PPO | Admitting: Oncology

## 2020-05-18 ENCOUNTER — Encounter: Payer: Self-pay | Admitting: Oncology

## 2020-05-18 ENCOUNTER — Other Ambulatory Visit: Payer: Self-pay

## 2020-05-18 ENCOUNTER — Inpatient Hospital Stay: Payer: BC Managed Care – PPO | Attending: Oncology | Admitting: Hematology and Oncology

## 2020-05-18 VITALS — BP 166/88 | HR 69 | Temp 98.0°F | Resp 18 | Ht 69.0 in | Wt 271.5 lb

## 2020-05-18 DIAGNOSIS — F17210 Nicotine dependence, cigarettes, uncomplicated: Secondary | ICD-10-CM | POA: Diagnosis not present

## 2020-05-18 DIAGNOSIS — D72829 Elevated white blood cell count, unspecified: Secondary | ICD-10-CM

## 2020-05-18 DIAGNOSIS — R700 Elevated erythrocyte sedimentation rate: Secondary | ICD-10-CM

## 2020-05-18 LAB — CBC AND DIFFERENTIAL
HCT: 48 (ref 41–53)
Hemoglobin: 15.9 (ref 13.5–17.5)
Neutrophils Absolute: 7.77
Platelets: 339 (ref 150–399)
WBC: 11.6

## 2020-05-18 LAB — SEDIMENTATION RATE: Sed Rate: 113 mm/hr — ABNORMAL HIGH (ref 0–16)

## 2020-05-18 LAB — CBC: RBC: 5.04 (ref 3.87–5.11)

## 2020-05-18 LAB — TSH: TSH: 1.995 u[IU]/mL (ref 0.350–4.500)

## 2020-05-18 LAB — LACTATE DEHYDROGENASE: LDH: 139 U/L (ref 98–192)

## 2020-05-18 NOTE — Progress Notes (Signed)
[facility]  [address] [place],  [place]  [address] [phone]  Clinic Day:  05/18/2020  Referring physician: Nonnie Done., MD   This document serves as a record of services personally performed by Gery Pray, MD. It was created on their behalf by [facility] E, a trained medical scribe. The creation of this record is based on the scribe's personal observations and the provider's statements to them.   CHIEF COMPLAINT:  CC: Leukocytosis  Current Treatment:  None   HISTORY OF PRESENT ILLNESS:  Walter Woods is a 44 y.o. male referred by Dr. Cheri Rous for the evaluation and treatment of leukocytosis.  This was first appreciated in February this year when his white count was 12.5 with an ANC of 8.5.  By March this had decreased to 11.4 with an ANC of 7.7, but then increased again in October to 14.6 with an ANC of 10.4 and an AMC of 1.3.  Hemoglobin and platelet counts have been normal.  Chemistries have been unremarkable.    INTERVAL HISTORY:  Lori states that he has had some trouble with his teeth, and some need to be pulled.  He was on phenobarbital for many years, but not currently.  He denies prednisone or lithium use.  He denies fever or chills, but has frequent sweats when he sleeps.  He works third shift, so sleeps during the day.  He had a heart attack many years ago, but did not require stent placement. He follows with Dr. Egbert Garibaldi.  He has occasional edema, and is on a diuretic.  He has occasional cough.  His white count today is 11.6 and hemoglobin and platelets are normal.  His  appetite is good, and he is eating well.  He denies fever, chills or other signs of infection.  He denies nausea, vomiting, bowel issues, or abdominal pain. He denies bone pain or skin rashes.  He denies dysuria or hematuria.  He denies sore throat, dyspnea, or chest pain.   REVIEW OF SYSTEMS:  Review of Systems  Respiratory: Positive for cough  (occasional).   Gastrointestinal:       Occasional numbness of the epigastrium  All other systems reviewed and are negative.    VITALS:  Blood pressure (!) 166/88, pulse 69, temperature 98 F (36.7 C), temperature source Oral, resp. rate 18, height 5\' 9"  (1.753 m), weight 271 lb 8 oz (123.2 kg), SpO2 98 %.  Wt Readings from Last 3 Encounters:  05/18/20 271 lb 8 oz (123.2 kg)  06/30/15 247 lb (112 kg)    Body mass index is 40.09 kg/m.  Performance status (ECOG): 1 - Symptomatic but completely ambulatory  PHYSICAL EXAM:  Physical Exam Constitutional:      General: He is not in acute distress.    Appearance: Normal appearance. He is normal weight.  HENT:     Head: Normocephalic and atraumatic.     Comments: He has horrendous oral hygiene with just a few fragments of teeth remaining.  There is no obvious abscess or tenderness. Eyes:     General: No scleral icterus.    Extraocular Movements: Extraocular movements intact.     Conjunctiva/sclera: Conjunctivae normal.     Pupils: Pupils are equal, round, and reactive to light.  Cardiovascular:     Rate and Rhythm: Normal rate and regular rhythm.     Pulses: Normal pulses.     Heart sounds: Normal heart sounds. No murmur heard.  No friction rub. No gallop.  Pulmonary:     Effort: Pulmonary effort is normal. No respiratory distress.     Breath sounds: Normal breath sounds.  Abdominal:     General: Bowel sounds are normal. There is no distension.     Palpations: Abdomen is soft. There is no mass.     Tenderness: There is no abdominal tenderness.  Musculoskeletal:        General: Normal range of motion.     Cervical back: Normal range of motion and neck supple.     Right lower leg: Edema (trace) present.     Left lower leg: Edema (trace) present.  Lymphadenopathy:     Cervical: No cervical adenopathy.  Skin:    General: Skin is warm and dry.  Neurological:     General: No focal deficit present.     Mental Status: He is  alert and oriented to person, place, and time. Mental status is at baseline.  Psychiatric:        Mood and Affect: Mood normal.        Behavior: Behavior normal.        Thought Content: Thought content normal.        Judgment: Judgment normal.   CBC today is near normal, improved from October.  His LDH is normal but his sed rate is 113!  Could this be from his dental condition?  LABS:   CBC Latest Ref Rng & Units 05/18/2020 06/30/2015  WBC - 11.6 10.8(H)  Hemoglobin 13.5 - 17.5 15.9 15.4  Hematocrit 41 - 53 48 46.4  Platelets 150 - 399 339 334   CMP Latest Ref Rng & Units 06/30/2015  Glucose 65 - 99 mg/dL 761(P)  BUN 6 - 20 mg/dL 12  Creatinine 5.09 - 3.26 mg/dL 7.12  Sodium 458 - 099 mmol/L 141  Potassium 3.5 - 5.1 mmol/L 4.2  Chloride 101 - 111 mmol/L 107  CO2 22 - 32 mmol/L 25  Calcium 8.9 - 10.3 mg/dL 9.2    No results found for: LDH  STUDIES:  No results found.    HISTORY:   Past Medical History:  Diagnosis Date  . Asthma   . Febrile seizures (HCC)    when child  . GERD (gastroesophageal reflux disease)   . History of diverticulitis   . Hypertension   . Myocardial infarction [facility])     Past Surgical History:  Procedure Laterality Date  . TESTICLE SURGERY Right    cyst removed     Family History  Problem Relation Age of Onset  . Anemia Mother   . Thyroid disease Mother   . Cervical cancer Maternal Grandmother     Social History:  reports that he has been smoking cigarettes. He started smoking about 30 years ago. He has a 60.00 pack-year smoking history. He has never used smokeless tobacco. He reports current alcohol use of about 3.0 standard drinks of alcohol per week. He reports that he does not use drugs.The patient is alone today.  He is married and lives at home with his spouse.  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  He is employed, and has never been exposed to chemicals.    Allergies:  Allergies  Allergen Reactions  . Phenobarbital       Current Medications: Current Outpatient Medications  Medication Sig Dispense Refill  . ibuprofen (ADVIL,MOTRIN) 800 MG tablet Take 1 tablet (800 mg total) by mouth 3 (three) times daily. 21 tablet 0   No current facility-administered medications for this visit.  ASSESSMENT & PLAN:   Assessment:   1.  Leukocytosis, mild with a white count back down to 11.6 today.  He is not on any medications that could contribute to this.  I will plan to check an LDH, sed rate and blood smear for completeness.  This is just a mild degree and he has a normal differential and so I have no suspicion for blood dyscrasia.  Smoking can elevate the white count to a mild degree.  He could also have some mild low grade dental infection with his few remaining teeth.  I will view his blood smear to rule out any abnormal morphology.  2.  Tobacco abuse.  He has cut way back but we discussed smoking cessation.  He would be a candidate for annual lung cancer screening low dose CT scans, but probably not until he is older.  3. Markedly elevated sed rate over 100 with no clear etiology but could be related to his poor dental condition.    Plan: His white count has come back down to 11.6 today.  There is no evidence of any form of blood dyscrasia, but I will check a blood smear, LDH and sed rate for completeness.  Factors that could contribute to leukocytosis are smoking and he has had trouble with his teeth, and so there may be chronic mild inflammation from this.  I do not feel I need to pursue more aggressive testing, such as a bone marrow or test for myeloproliferative disorder unless his leukocytosis progresses significantly.  He may continue to follow with Dr. Egbert Garibaldi, and I would recommend a CBC every 6 months.  I will not schedule him for follow up here, but will be glad to see him back if needed.  He understands and agrees with this plan of care.  I have answered his questions and he knows to call with any  concerns.  Thank you for the opportunity    I provided 40 minutes of face-to-face time during this this encounter and > 50% was spent counseling as documented under my assessment and plan.    Dellia Beckwith, MD [facility] AT [facility] [address] [place] [address] Dept: [phone] Dept Fax: [phone]   I, Foye Deer, am acting as scribe for Dellia Beckwith, MD  I have reviewed this report as typed by the medical scribe, and it is complete and accurate.

## 2021-09-21 DIAGNOSIS — R079 Chest pain, unspecified: Secondary | ICD-10-CM | POA: Diagnosis not present

## 2021-09-22 DIAGNOSIS — R079 Chest pain, unspecified: Secondary | ICD-10-CM

## 2023-09-06 NOTE — Progress Notes (Signed)
 New Patient Office Visit  Subjective    Patient ID: Walter Woods, male    DOB: 1975-08-17  Age: 48 y.o. MRN: [id number]  CC: No chief complaint on file.   HPI Walter Woods presents to establish care today. PMH includes HTN, hx MI, seizure disorder as a child, tobacco use, leukocytosis, hx diverticulitis, asthma. Reports compliance with medication regimen. Does not check blood pressures at home. Reports lower leg swelling and pain since starting amlodipine. Also reports erectile dysfunction since starting Flomax with amlodipine. Denies any dizziness, palpitations, chest pain, shortness of breath, diaphoresis, other symptoms. Would like to discuss weight loss. States he only eats 1 meal a day, and is very active at work.  Works night shift. Denies other concerns today. He is fasting today. Medical history as outlined below. Due for Tdap, flu, pneumonia vaccines.  Will do flu and Tdap today.     Outpatient Encounter Medications as of 09/08/2023  Medication Sig   albuterol (VENTOLIN HFA) 108 (90 Base) MCG/ACT inhaler Inhale into the lungs.   atenolol (TENORMIN) 50 MG tablet Take 50 mg by mouth daily.   atorvastatin (LIPITOR) 40 MG tablet Take 40 mg by mouth daily.   ibuprofen (ADVIL,MOTRIN) 800 MG tablet Take 1 tablet (800 mg total) by mouth 3 (three) times daily.   losartan-hydrochlorothiazide (HYZAAR) 100-25 MG tablet Take 1 tablet by mouth daily.   [DISCONTINUED] amLODipine (NORVASC) 10 MG tablet Take 10 mg by mouth 2 (two) times daily.   [DISCONTINUED] furosemide (LASIX) 20 MG tablet Take 20 mg by mouth daily.   [DISCONTINUED] hydrochlorothiazide (HYDRODIURIL) 25 MG tablet Take 1 tablet by mouth daily.   [DISCONTINUED] losartan (COZAAR) 50 MG tablet Take 1 tablet by mouth daily.   [DISCONTINUED] tamsulosin (FLOMAX) 0.4 MG CAPS capsule Take 0.4 mg by mouth daily.   No facility-administered encounter medications on file as of 09/08/2023.    Past Medical History:   Diagnosis Date   Asthma    Febrile seizures (HCC)    when child   GERD (gastroesophageal reflux disease)    History of diverticulitis    Hypertension    Myocardial infarction [facility])     Past Surgical History:  Procedure Laterality Date   TESTICLE SURGERY Right    cyst removed     Family History  Problem Relation Age of Onset   Anemia Mother    Thyroid disease Mother    Cervical cancer Maternal Grandmother     Social History   Socioeconomic History   Marital status: Married    Spouse name: Not on file   Number of children: 2   Years of education: Not on file   Highest education level: Not on file  Occupational History   Not on file  Tobacco Use   Smoking status: Every Day    Current packs/day: 2.00    Average packs/day: 2.0 packs/day for 34.2 years (68.4 ttl pk-yrs)    Types: Cigarettes    Start date: 07/04/1989   Smokeless tobacco: Never   Tobacco comments:    down to 1/4 ppd  Vaping Use   Vaping status: Never Used  Substance and Sexual Activity   Alcohol use: Yes    Alcohol/week: 3.0 standard drinks of alcohol    Types: 3 Cans of beer per week   Drug use: Never   Sexual activity: Not on file  Other Topics Concern   Not on file  Social History Narrative   Not on file   Social Drivers of  Health   Financial Resource Strain: Not on file  Food Insecurity: Not on file  Transportation Needs: Not on file  Physical Activity: Not on file  Stress: Not on file  Social Connections: Not on file  Intimate Partner Violence: Not on file    ROS Per HPI      Objective    BP 128/82   Pulse 74   Temp 98 F (36.7 C) (Temporal)   Ht 5\' 9"  (1.753 m)   Wt (!) 306 lb 9.6 oz (139.1 kg)   SpO2 98%   BMI 45.28 kg/m   Physical Exam Vitals and nursing note reviewed.  Constitutional:      General: He is not in acute distress.    Appearance: Normal appearance. He is obese.  HENT:     Head: Normocephalic and atraumatic.     Nose: Nose normal.      Mouth/Throat:     Mouth: Mucous membranes are moist.     Pharynx: Oropharynx is clear.  Eyes:     Extraocular Movements: Extraocular movements intact.  Cardiovascular:     Rate and Rhythm: Normal rate and regular rhythm.     Pulses: Normal pulses.     Heart sounds: Normal heart sounds.  Pulmonary:     Effort: Pulmonary effort is normal. No respiratory distress.     Breath sounds: Normal breath sounds. No wheezing, rhonchi or rales.  Musculoskeletal:        General: Normal range of motion.     Cervical back: Normal range of motion.     Right lower leg: Edema present.     Left lower leg: Edema present.     Comments: 3+ non pitting  Lymphadenopathy:     Cervical: No cervical adenopathy.  Skin:    General: Skin is warm and dry.     Capillary Refill: Capillary refill takes less than 2 seconds.  Neurological:     General: No focal deficit present.     Mental Status: He is alert and oriented to person, place, and time.  Psychiatric:        Mood and Affect: Mood normal.        Behavior: Behavior normal.        Assessment & Plan:   Primary hypertension -     CBC with Differential/Platelet -     Comprehensive metabolic panel -     Losartan Potassium-HCTZ; Take 1 tablet by mouth daily.  Dispense: 90 tablet; Refill: 1  Mild intermittent asthma without complication -     CBC with Differential/Platelet -     Comprehensive metabolic panel  History of MI (myocardial infarction) -     Lipid panel  Pain and swelling of lower leg, unspecified laterality -     CBC with Differential/Platelet -     Comprehensive metabolic panel  Hyperglycemia -     Lipid panel -     Hemoglobin A1c -     POCT urinalysis dipstick  Weight gain -     Thyroid Panel With TSH  Mixed hyperlipidemia -     Lipid panel  Urinary frequency -     POCT urinalysis dipstick  Encounter for vaccination -     Tdap vaccine greater than or equal to 7yo IM  Shift work sleep disorder -     VITAMIN D 25  Hydroxy (Vit-D Deficiency, Fractures)  Encounter for immunization -     Flu vaccine trivalent PF, 6mos and older(Flulaval,Afluria,Fluarix,Fluzone) -     Tdap  vaccine greater than or equal to 7yo IM     Return in about 4 weeks (around 10/06/2023) for meds check.   Moshe Cipro, FNP

## 2023-09-08 ENCOUNTER — Encounter: Payer: Self-pay | Admitting: Family Medicine

## 2023-09-08 ENCOUNTER — Ambulatory Visit: Payer: Commercial Managed Care - PPO | Admitting: Family Medicine

## 2023-09-08 VITALS — BP 128/82 | HR 74 | Temp 98.0°F | Ht 69.0 in | Wt 306.6 lb

## 2023-09-08 DIAGNOSIS — I252 Old myocardial infarction: Secondary | ICD-10-CM

## 2023-09-08 DIAGNOSIS — J4520 Mild intermittent asthma, uncomplicated: Secondary | ICD-10-CM

## 2023-09-08 DIAGNOSIS — R350 Frequency of micturition: Secondary | ICD-10-CM | POA: Diagnosis not present

## 2023-09-08 DIAGNOSIS — R739 Hyperglycemia, unspecified: Secondary | ICD-10-CM

## 2023-09-08 DIAGNOSIS — E559 Vitamin D deficiency, unspecified: Secondary | ICD-10-CM

## 2023-09-08 DIAGNOSIS — E782 Mixed hyperlipidemia: Secondary | ICD-10-CM | POA: Diagnosis not present

## 2023-09-08 DIAGNOSIS — Z23 Encounter for immunization: Secondary | ICD-10-CM

## 2023-09-08 DIAGNOSIS — R635 Abnormal weight gain: Secondary | ICD-10-CM | POA: Insufficient documentation

## 2023-09-08 DIAGNOSIS — M7989 Other specified soft tissue disorders: Secondary | ICD-10-CM | POA: Diagnosis not present

## 2023-09-08 DIAGNOSIS — I10 Essential (primary) hypertension: Secondary | ICD-10-CM | POA: Diagnosis not present

## 2023-09-08 DIAGNOSIS — E66813 Obesity, class 3: Secondary | ICD-10-CM

## 2023-09-08 DIAGNOSIS — M79669 Pain in unspecified lower leg: Secondary | ICD-10-CM

## 2023-09-08 DIAGNOSIS — G4726 Circadian rhythm sleep disorder, shift work type: Secondary | ICD-10-CM

## 2023-09-08 HISTORY — DX: Mixed hyperlipidemia: E78.2

## 2023-09-08 HISTORY — DX: Essential (primary) hypertension: I10

## 2023-09-08 HISTORY — DX: Abnormal weight gain: R63.5

## 2023-09-08 HISTORY — DX: Old myocardial infarction: I25.2

## 2023-09-08 HISTORY — DX: Hyperglycemia, unspecified: R73.9

## 2023-09-08 HISTORY — DX: Other specified soft tissue disorders: M79.669

## 2023-09-08 HISTORY — DX: Mild intermittent asthma, uncomplicated: J45.20

## 2023-09-08 LAB — POCT URINALYSIS DIPSTICK
Bilirubin, UA: NEGATIVE
Blood, UA: POSITIVE
Glucose, UA: NEGATIVE
Ketones, UA: NEGATIVE
Leukocytes, UA: NEGATIVE
Nitrite, UA: NEGATIVE
Protein, UA: NEGATIVE
Spec Grav, UA: 1.01 (ref 1.010–1.025)
Urobilinogen, UA: 0.2 U/dL
pH, UA: 6 (ref 5.0–8.0)

## 2023-09-08 LAB — COMPREHENSIVE METABOLIC PANEL
ALT: 38 U/L (ref 0–53)
AST: 22 U/L (ref 0–37)
Albumin: 4.4 g/dL (ref 3.5–5.2)
Alkaline Phosphatase: 81 U/L (ref 39–117)
BUN: 11 mg/dL (ref 6–23)
CO2: 25 meq/L (ref 19–32)
Calcium: 9.2 mg/dL (ref 8.4–10.5)
Chloride: 102 meq/L (ref 96–112)
Creatinine, Ser: 0.9 mg/dL (ref 0.40–1.50)
GFR: 101.27 mL/min (ref >60.00)
Glucose, Bld: 76 mg/dL (ref 70–99)
Potassium: 3.7 meq/L (ref 3.5–5.1)
Sodium: 139 meq/L (ref 135–145)
Total Bilirubin: 0.6 mg/dL (ref 0.2–1.2)
Total Protein: 7.8 g/dL (ref 6.0–8.3)

## 2023-09-08 LAB — CBC WITH DIFFERENTIAL/PLATELET
Basophils Absolute: 0.1 10*3/uL (ref 0.0–0.1)
Basophils Relative: 0.4 % (ref 0.0–3.0)
Eosinophils Absolute: 0.4 10*3/uL (ref 0.0–0.7)
Eosinophils Relative: 3 % (ref 0.0–5.0)
HCT: 48.7 % (ref 39.0–52.0)
Hemoglobin: 16.3 g/dL (ref 13.0–17.0)
Lymphocytes Relative: 19 % (ref 12.0–46.0)
Lymphs Abs: 2.5 10*3/uL (ref 0.7–4.0)
MCHC: 33.4 g/dL (ref 30.0–36.0)
MCV: 93.6 fl (ref 78.0–100.0)
Monocytes Absolute: 1.4 10*3/uL — ABNORMAL HIGH (ref 0.1–1.0)
Monocytes Relative: 10.4 % (ref 3.0–12.0)
Neutro Abs: 8.9 10*3/uL — ABNORMAL HIGH (ref 1.4–7.7)
Neutrophils Relative %: 67.2 % (ref 43.0–77.0)
Platelets: 317 10*3/uL (ref 150.0–400.0)
RBC: 5.2 Mil/uL (ref 4.22–5.81)
RDW: 13.7 % (ref 11.5–15.5)
WBC: 13.3 10*3/uL — ABNORMAL HIGH (ref 4.0–10.5)

## 2023-09-08 LAB — LIPID PANEL
Cholesterol: 128 mg/dL (ref 0–200)
HDL: 36.1 mg/dL — ABNORMAL LOW (ref >39.00)
LDL Cholesterol: 72 mg/dL (ref 0–99)
NonHDL: 91.76
Total CHOL/HDL Ratio: 4
Triglycerides: 99 mg/dL (ref 0.0–149.0)
VLDL: 19.8 mg/dL (ref 0.0–40.0)

## 2023-09-08 LAB — VITAMIN D 25 HYDROXY (VIT D DEFICIENCY, FRACTURES): VITD: 12.75 ng/mL — ABNORMAL LOW (ref 30.00–100.00)

## 2023-09-08 LAB — HEMOGLOBIN A1C: Hgb A1c MFr Bld: 6.5 % (ref 4.6–6.5)

## 2023-09-08 MED ORDER — VITAMIN D (ERGOCALCIFEROL) 1.25 MG (50000 UNIT) PO CAPS: 50000.0000 [IU] | ORAL_CAPSULE | ORAL | 0 refills | Status: AC

## 2023-09-08 MED ORDER — LOSARTAN POTASSIUM-HCTZ 100-25 MG PO TABS: 1.0000 | ORAL_TABLET | Freq: Every day | ORAL | 1 refills | Status: DC

## 2023-09-08 NOTE — Addendum Note (Signed)
 Addended by: Sherald Barge on: 09/08/2023 05:25 PM   Modules accepted: Orders

## 2023-09-08 NOTE — Patient Instructions (Addendum)
 Welcome to [facility]!  STOP amlodipine  STOP furosemide  STOP tamsulosin  START losartan-hydrochlorothiazide 100-25 mg once daily when you wake up.  Wear compression socks, and a good pair of shoes.  We are checking labs today, will be in contact with any results that require further attention.  Follow up with me in a month for recheck of your medications.

## 2023-09-09 LAB — THYROID PANEL WITH TSH
Free Thyroxine Index: 2.2 (ref 1.4–3.8)
T3 Uptake: 27 % (ref 22–35)
T4, Total: 8.2 ug/dL (ref 4.9–10.5)
TSH: 3.07 m[IU]/L (ref 0.40–4.50)

## 2023-09-11 NOTE — Addendum Note (Signed)
 Addended by: Sherald Barge on: 09/11/2023 09:57 AM   Modules accepted: Orders

## 2023-09-13 ENCOUNTER — Encounter (INDEPENDENT_AMBULATORY_CARE_PROVIDER_SITE_OTHER): Payer: Self-pay

## 2023-10-04 NOTE — Progress Notes (Signed)
 Established Patient Office Visit  Subjective   Patient ID: Walter Woods, male    DOB: August 22, 1975  Age: 48 y.o. MRN: [id number]  Chief Complaint  Patient presents with   Medical Management of Chronic Issues    4 Week Follow Up. Patient noted symptoms have alleviated. Has not taken blood pressure medication this morning. Notes with some foods he is having some indigestion which has been new    HPI Patient presents today for medication management, f/u HTN, weight, lower leg swelling, erectile dysfunction.  At last visit, stopped amlodipine (possible cause of leg swelling). Changed to losartan-hydrochlorothiazide 100-25 4 weeks ago.  Reports compliance with medication regimen. Reports that lower leg swelling has decreased significantly, headaches have resolved.  Due for prevnar 20, will get today. States that he has changed his diet and is eating smaller portion sizes.  Denies the need for refills. Not fasting today. Denies other concerns. Medical history as outlined below.  ROS Per HPI    Objective:     BP 128/76   Pulse 74   Temp 97.9 F (36.6 C)   Ht 5\' 9"  (1.753 m)   Wt (!) 303 lb 3.2 oz (137.5 kg)   SpO2 98%   BMI 44.77 kg/m   Physical Exam Vitals and nursing note reviewed.  Constitutional:      General: He is not in acute distress.    Appearance: Normal appearance. He is obese.  HENT:     Head: Normocephalic and atraumatic.     Right Ear: External ear normal.     Left Ear: External ear normal.  Eyes:     Extraocular Movements: Extraocular movements intact.  Neck:     Vascular: No carotid bruit.  Cardiovascular:     Rate and Rhythm: Normal rate and regular rhythm.     Pulses: Normal pulses.     Heart sounds: Normal heart sounds.  Pulmonary:     Effort: Pulmonary effort is normal. No respiratory distress.     Breath sounds: Normal breath sounds. No wheezing, rhonchi or rales.  Musculoskeletal:        General: Normal range of motion.     Cervical  back: Normal range of motion.     Right lower leg: Edema (+2 nonpitting) present.     Left lower leg: Edema (+2 nonpitting) present.  Lymphadenopathy:     Cervical: No cervical adenopathy.  Skin:    General: Skin is warm and dry.  Neurological:     General: No focal deficit present.     Mental Status: He is alert and oriented to person, place, and time.  Psychiatric:        Mood and Affect: Mood normal.        Behavior: Behavior normal.     No results found for any visits on 10/06/23.   The ASCVD Risk score (Arnett DK, et al., 2019) failed to calculate for the following reasons:   Risk score cannot be calculated because patient has a medical history suggesting prior/existing ASCVD    Assessment & Plan:   Primary hypertension Assessment & Plan: Controlled, continue Hyzaar Continue efforts in low salt diet Discussed the use of compression socks to help with lower leg swelling and blood pressure management   Mixed hyperlipidemia Assessment & Plan: Controlled, continue lipitor Decrease fats in the diet, discussed healthy activity level   Hyperglycemia Assessment & Plan: A1c was 6.5, prediabetes Discussed low sweets, low carb diet and healthy activity level Has all but stopped  drinking mountain dew   History of MI (myocardial infarction) Assessment & Plan: Discussed that one of the best ways to prevent another MI is to control blood sugar, blood pressure, weight, cholesterol Has made lifestyle changes since visit a month ago, continue efforts in these   Medication management Assessment & Plan: Continue current medication regimen We will recheck labs in about 3 months Blood pressure controlled   Gastroesophageal reflux disease with esophagitis without hemorrhage Assessment & Plan: Education handout on GERD diet given Discussed nonmedical management Omeprazole sent to pharmacy  Orders: -     Omeprazole Magnesium; Take 1 tablet (20 mg total) by mouth daily.   Dispense: 90 tablet; Refill: 1  Encounter for administration of vaccine -     Pneumococcal conjugate vaccine 20-valent     Return in about 3 months (around 01/05/2024) for meds, labs.    Sherald Barge, FNP

## 2023-10-06 ENCOUNTER — Encounter: Payer: Self-pay | Admitting: Family Medicine

## 2023-10-06 ENCOUNTER — Ambulatory Visit: Admitting: Family Medicine

## 2023-10-06 VITALS — BP 128/76 | HR 74 | Temp 97.9°F | Ht 69.0 in | Wt 303.2 lb

## 2023-10-06 DIAGNOSIS — E782 Mixed hyperlipidemia: Secondary | ICD-10-CM

## 2023-10-06 DIAGNOSIS — R739 Hyperglycemia, unspecified: Secondary | ICD-10-CM

## 2023-10-06 DIAGNOSIS — I252 Old myocardial infarction: Secondary | ICD-10-CM | POA: Diagnosis not present

## 2023-10-06 DIAGNOSIS — Z23 Encounter for immunization: Secondary | ICD-10-CM

## 2023-10-06 DIAGNOSIS — I10 Essential (primary) hypertension: Secondary | ICD-10-CM

## 2023-10-06 DIAGNOSIS — Z79899 Other long term (current) drug therapy: Secondary | ICD-10-CM

## 2023-10-06 DIAGNOSIS — K2100 Gastro-esophageal reflux disease with esophagitis, without bleeding: Secondary | ICD-10-CM

## 2023-10-06 MED ORDER — OMEPRAZOLE MAGNESIUM 20 MG PO TBEC: 20.0000 mg | DELAYED_RELEASE_TABLET | Freq: Every day | ORAL | 1 refills | Status: AC

## 2023-10-06 NOTE — Patient Instructions (Addendum)
 I have attached information about dietary management of acid reflux.   I have sent in omeprazole for you to take once daily.  We have given your pneumonia vaccine in the office today.  Follow up with me in about 3 months to recheck labs and medications, sooner if needed.

## 2023-10-11 DIAGNOSIS — K2100 Gastro-esophageal reflux disease with esophagitis, without bleeding: Secondary | ICD-10-CM

## 2023-10-11 DIAGNOSIS — Z79899 Other long term (current) drug therapy: Secondary | ICD-10-CM

## 2023-10-11 HISTORY — DX: Gastro-esophageal reflux disease with esophagitis, without bleeding: K21.00

## 2023-10-11 HISTORY — DX: Other long term (current) drug therapy: Z79.899

## 2023-10-11 NOTE — Assessment & Plan Note (Signed)
 Education handout on GERD diet given Discussed nonmedical management Omeprazole sent to pharmacy

## 2023-10-11 NOTE — Assessment & Plan Note (Signed)
 A1c was 6.5, prediabetes Discussed low sweets, low carb diet and healthy activity level Has all but stopped drinking mountain dew

## 2023-10-11 NOTE — Assessment & Plan Note (Signed)
 Controlled, continue lipitor Decrease fats in the diet, discussed healthy activity level

## 2023-10-11 NOTE — Assessment & Plan Note (Signed)
 Controlled, continue Hyzaar Continue efforts in low salt diet Discussed the use of compression socks to help with lower leg swelling and blood pressure management

## 2023-10-11 NOTE — Assessment & Plan Note (Signed)
 Continue current medication regimen We will recheck labs in about 3 months Blood pressure controlled

## 2023-10-11 NOTE — Assessment & Plan Note (Signed)
 Discussed that one of the best ways to prevent another MI is to control blood sugar, blood pressure, weight, cholesterol Has made lifestyle changes since visit a month ago, continue efforts in these

## 2023-12-26 ENCOUNTER — Telehealth: Payer: Self-pay | Admitting: Family Medicine

## 2023-12-26 NOTE — Telephone Encounter (Signed)
 Copied from CRM [id number]. Topic: Clinical - Medical Advice >> Dec 26, 2023 10:11 AM Chiquita SQUIBB wrote: Reason for CRM: Patient is calling in with chest congestion, cough ,cold symptoms, and green drainage. Patient has taken every cold medicine and has had no improvements, patient is requesting an antibiotic.

## 2023-12-29 NOTE — Telephone Encounter (Signed)
 Called pt to sch but he opted to just wait for his appointment on 7.11.25

## 2024-01-11 DIAGNOSIS — E559 Vitamin D deficiency, unspecified: Secondary | ICD-10-CM

## 2024-01-11 DIAGNOSIS — R7303 Prediabetes: Secondary | ICD-10-CM

## 2024-01-11 HISTORY — DX: Vitamin D deficiency, unspecified: E55.9

## 2024-01-11 HISTORY — DX: Prediabetes: R73.03

## 2024-01-11 NOTE — Progress Notes (Unsigned)
   Acute Office Visit  Subjective:     Patient ID: Walter Woods, male    DOB: June 05, 1976, 48 y.o.   MRN: [id number]  Chief Complaint  Patient presents with  . Medical Management of Chronic Issues    3 month follow up no new issue or concerns     HPI  Discussed the use of AI scribe software for clinical note transcription with the patient, who gave verbal consent to proceed.  Smoking 3/4 pack per day  History of Present Illness      ROS Per HPI      Objective:    BP (!) 144/94   Pulse 68   Temp 98.2 F (36.8 C) (Temporal)   Ht 5' 9 (1.753 m)   Wt (!) 304 lb 8 oz (138.1 kg)   SpO2 98%   BMI 44.97 kg/m    Physical Exam Vitals and nursing note reviewed.  Constitutional:      General: He is not in acute distress.    Appearance: Normal appearance. He is obese.  HENT:     Head: Normocephalic and atraumatic.     Right Ear: External ear normal.     Left Ear: External ear normal.  Eyes:     Extraocular Movements: Extraocular movements intact.  Neck:     Vascular: No carotid bruit.  Cardiovascular:     Rate and Rhythm: Normal rate and regular rhythm.     Pulses: Normal pulses.     Heart sounds: Normal heart sounds.  Pulmonary:     Effort: Pulmonary effort is normal. No respiratory distress.     Breath sounds: Normal breath sounds. No wheezing, rhonchi or rales.  Musculoskeletal:        General: Normal range of motion.     Cervical back: Normal range of motion.     Right lower leg: Edema (+2 nonpitting) present.     Left lower leg: Edema (+2 nonpitting) present.  Lymphadenopathy:     Cervical: No cervical adenopathy.  Skin:    General: Skin is warm and dry.  Neurological:     General: No focal deficit present.     Mental Status: He is alert and oriented to person, place, and time.  Psychiatric:        Mood and Affect: Mood normal.        Behavior: Behavior normal.     No results found for any visits on 01/12/24.      Assessment & Plan:    Assessment and Plan Assessment & Plan       No orders of the defined types were placed in this encounter.   No follow-ups on file.  Corean LITTIE Ku, FNP

## 2024-01-12 ENCOUNTER — Other Ambulatory Visit (HOSPITAL_COMMUNITY): Payer: Self-pay

## 2024-01-12 ENCOUNTER — Ambulatory Visit: Payer: Self-pay | Admitting: Family Medicine

## 2024-01-12 ENCOUNTER — Encounter: Payer: Self-pay | Admitting: Family Medicine

## 2024-01-12 ENCOUNTER — Telehealth: Payer: Self-pay

## 2024-01-12 ENCOUNTER — Ambulatory Visit: Admitting: Family Medicine

## 2024-01-12 VITALS — BP 144/94 | HR 68 | Temp 98.2°F | Ht 69.0 in | Wt 304.5 lb

## 2024-01-12 DIAGNOSIS — K2100 Gastro-esophageal reflux disease with esophagitis, without bleeding: Secondary | ICD-10-CM

## 2024-01-12 DIAGNOSIS — Z716 Tobacco abuse counseling: Secondary | ICD-10-CM | POA: Diagnosis not present

## 2024-01-12 DIAGNOSIS — J45909 Unspecified asthma, uncomplicated: Secondary | ICD-10-CM | POA: Insufficient documentation

## 2024-01-12 DIAGNOSIS — Z8719 Personal history of other diseases of the digestive system: Secondary | ICD-10-CM | POA: Insufficient documentation

## 2024-01-12 DIAGNOSIS — R5600 Simple febrile convulsions: Secondary | ICD-10-CM | POA: Insufficient documentation

## 2024-01-12 DIAGNOSIS — R7303 Prediabetes: Secondary | ICD-10-CM

## 2024-01-12 DIAGNOSIS — E559 Vitamin D deficiency, unspecified: Secondary | ICD-10-CM

## 2024-01-12 DIAGNOSIS — I252 Old myocardial infarction: Secondary | ICD-10-CM | POA: Diagnosis not present

## 2024-01-12 DIAGNOSIS — Z79899 Other long term (current) drug therapy: Secondary | ICD-10-CM | POA: Diagnosis not present

## 2024-01-12 DIAGNOSIS — M7989 Other specified soft tissue disorders: Secondary | ICD-10-CM | POA: Diagnosis not present

## 2024-01-12 DIAGNOSIS — E782 Mixed hyperlipidemia: Secondary | ICD-10-CM

## 2024-01-12 DIAGNOSIS — I10 Essential (primary) hypertension: Secondary | ICD-10-CM

## 2024-01-12 DIAGNOSIS — K219 Gastro-esophageal reflux disease without esophagitis: Secondary | ICD-10-CM | POA: Insufficient documentation

## 2024-01-12 DIAGNOSIS — E1159 Type 2 diabetes mellitus with other circulatory complications: Secondary | ICD-10-CM

## 2024-01-12 DIAGNOSIS — I219 Acute myocardial infarction, unspecified: Secondary | ICD-10-CM | POA: Insufficient documentation

## 2024-01-12 LAB — CBC WITH DIFFERENTIAL/PLATELET
Basophils Absolute: 0 K/uL (ref 0.0–0.1)
Basophils Relative: 0.4 % (ref 0.0–3.0)
Eosinophils Absolute: 0.4 K/uL (ref 0.0–0.7)
Eosinophils Relative: 3.4 % (ref 0.0–5.0)
HCT: 46.6 % (ref 39.0–52.0)
Hemoglobin: 15.3 g/dL (ref 13.0–17.0)
Lymphocytes Relative: 18.6 % (ref 12.0–46.0)
Lymphs Abs: 2.4 K/uL (ref 0.7–4.0)
MCHC: 32.9 g/dL (ref 30.0–36.0)
MCV: 93.4 fl (ref 78.0–100.0)
Monocytes Absolute: 1.2 K/uL — ABNORMAL HIGH (ref 0.1–1.0)
Monocytes Relative: 9.5 % (ref 3.0–12.0)
Neutro Abs: 8.9 K/uL — ABNORMAL HIGH (ref 1.4–7.7)
Neutrophils Relative %: 68.1 % (ref 43.0–77.0)
Platelets: 294 K/uL (ref 150.0–400.0)
RBC: 4.99 Mil/uL (ref 4.22–5.81)
RDW: 14 % (ref 11.5–15.5)
WBC: 13.1 K/uL — ABNORMAL HIGH (ref 4.0–10.5)

## 2024-01-12 LAB — COMPREHENSIVE METABOLIC PANEL WITH GFR
ALT: 39 U/L (ref 0–53)
AST: 21 U/L (ref 0–37)
Albumin: 4.1 g/dL (ref 3.5–5.2)
Alkaline Phosphatase: 80 U/L (ref 39–117)
BUN: 8 mg/dL (ref 6–23)
CO2: 28 meq/L (ref 19–32)
Calcium: 8.7 mg/dL (ref 8.4–10.5)
Chloride: 104 meq/L (ref 96–112)
Creatinine, Ser: 1 mg/dL (ref 0.40–1.50)
GFR: 89.03 mL/min (ref >60.00)
Glucose, Bld: 100 mg/dL — ABNORMAL HIGH (ref 70–99)
Potassium: 3.8 meq/L (ref 3.5–5.1)
Sodium: 137 meq/L (ref 135–145)
Total Bilirubin: 0.7 mg/dL (ref 0.2–1.2)
Total Protein: 7.2 g/dL (ref 6.0–8.3)

## 2024-01-12 LAB — LIPID PANEL
Cholesterol: 147 mg/dL (ref 0–200)
HDL: 27.3 mg/dL — ABNORMAL LOW (ref >39.00)
LDL Cholesterol: 88 mg/dL (ref 0–99)
NonHDL: 119.77
Total CHOL/HDL Ratio: 5
Triglycerides: 161 mg/dL — ABNORMAL HIGH (ref 0.0–149.0)
VLDL: 32.2 mg/dL (ref 0.0–40.0)

## 2024-01-12 LAB — MICROALBUMIN / CREATININE URINE RATIO
Creatinine,U: 91 mg/dL
Microalb Creat Ratio: UNDETERMINED mg/g (ref 0.0–30.0)
Microalb, Ur: <0.7 mg/dL

## 2024-01-12 LAB — HEMOGLOBIN A1C: Hgb A1c MFr Bld: 7.3 % — ABNORMAL HIGH (ref 4.6–6.5)

## 2024-01-12 LAB — BRAIN NATRIURETIC PEPTIDE: Pro B Natriuretic peptide (BNP): 76 pg/mL (ref 0.0–100.0)

## 2024-01-12 LAB — VITAMIN D 25 HYDROXY (VIT D DEFICIENCY, FRACTURES): VITD: 17.74 ng/mL — ABNORMAL LOW (ref 30.00–100.00)

## 2024-01-12 MED ORDER — LOSARTAN POTASSIUM-HCTZ 100-25 MG PO TABS: 1.0000 | ORAL_TABLET | Freq: Every day | ORAL | 1 refills | Status: AC

## 2024-01-12 MED ORDER — ATENOLOL 50 MG PO TABS: 50.0000 mg | ORAL_TABLET | Freq: Every day | ORAL | 1 refills | Status: DC

## 2024-01-12 MED ORDER — ALBUTEROL SULFATE HFA 108 (90 BASE) MCG/ACT IN AERS: 2.0000 | INHALATION_SPRAY | Freq: Four times a day (QID) | RESPIRATORY_TRACT | 3 refills | Status: DC | PRN

## 2024-01-12 MED ORDER — TIRZEPATIDE 2.5 MG/0.5ML ~~LOC~~ SOAJ: 2.5000 mg | SUBCUTANEOUS | 0 refills | Status: DC

## 2024-01-12 MED ORDER — ATORVASTATIN CALCIUM 40 MG PO TABS: 40.0000 mg | ORAL_TABLET | Freq: Every day | ORAL | 1 refills | Status: AC

## 2024-01-12 NOTE — Patient Instructions (Signed)
 Referral to cardiology today  We are checking labs today, will be in contact with any results that require further attention  If labs are ok, will send in cialis

## 2024-01-12 NOTE — Telephone Encounter (Signed)
 Pharmacy Patient Advocate Encounter   Received notification from CoverMyMeds that prior authorization for Mounjaro  2.5MG /0.5ML auto-injectors is required/requested.   Insurance verification completed.   The patient is insured through [facility] .   Per test claim: PA required; PA submitted to above mentioned insurance via CoverMyMeds Key/confirmation #/EOC [id number] Status is pending

## 2024-01-12 NOTE — Progress Notes (Unsigned)
 Established Patient Office Visit  Subjective   Patient ID: Walter Woods, male    DOB: 30-Apr-1976  Age: 48 y.o. MRN: [id number]  Chief Complaint  Patient presents with   Medical Management of Chronic Issues    3 month follow up no new issue or concerns     HPI  Patient presents for routine follow-up of chronic conditions. He reports that he is feeling well overall and does not feel like he has had any significant changes in his health since he was last seen in the office. The patient shares that he continues to experience bilateral lower extremity edema but feels that it may be slightly improved from previous visits. He reports that the swelling worsens with prolonged standing and improves when he is able to sit with his legs elevated. He denies chest pain, dyspnea, orthopnea, or leg pain.    Patient voices concern about premature ejaculation. States, I just climax way too fast. He states this is a new concern and is causing a great deal of distress and frustration. He denies any concerns with achieving or maintaining an erection.   {History (Optional):23778}  ROS See HPI   Objective:     BP (!) 144/94   Pulse 68   Temp 98.2 F (36.8 C) (Temporal)   Ht 5' 9 (1.753 m)   Wt (!) 304 lb 8 oz (138.1 kg)   SpO2 98%   BMI 44.97 kg/m  {Vitals History (Optional):23777}  Physical Exam Constitutional:      General: He is not in acute distress.    Appearance: He is obese.  HENT:     Head: Normocephalic and atraumatic.     Nose: Nose normal.  Cardiovascular:     Rate and Rhythm: Normal rate and regular rhythm.     Pulses: Normal pulses.     Heart sounds: Normal heart sounds.  Pulmonary:     Effort: Pulmonary effort is normal.     Breath sounds: Wheezing present.     Comments: Right upper and left upper lung fields Musculoskeletal:        General: Normal range of motion.     Cervical back: Normal range of motion.  Skin:    General: Skin is warm and dry.      Capillary Refill: Capillary refill takes less than 2 seconds.  Neurological:     Mental Status: He is alert and oriented to person, place, and time.  Psychiatric:        Mood and Affect: Mood normal.        Behavior: Behavior normal.    Results for orders placed or performed in visit on 01/12/24  CBC with Differential/Platelet  Result Value Ref Range   WBC 13.1 (H) 4.0 - 10.5 K/uL   RBC 4.99 4.22 - 5.81 Mil/uL   Hemoglobin 15.3 13.0 - 17.0 g/dL   HCT 53.3 60.9 - 47.9 %   MCV 93.4 78.0 - 100.0 fl   MCHC 32.9 30.0 - 36.0 g/dL   RDW 85.9 88.4 - 84.4 %   Platelets 294.0 150.0 - 400.0 K/uL   Neutrophils Relative % 68.1 43.0 - 77.0 %   Lymphocytes Relative 18.6 12.0 - 46.0 %   Monocytes Relative 9.5 3.0 - 12.0 %   Eosinophils Relative 3.4 0.0 - 5.0 %   Basophils Relative 0.4 0.0 - 3.0 %   Neutro Abs 8.9 (H) 1.4 - 7.7 K/uL   Lymphs Abs 2.4 0.7 - 4.0 K/uL   Monocytes Absolute 1.2 (  H) 0.1 - 1.0 K/uL   Eosinophils Absolute 0.4 0.0 - 0.7 K/uL   Basophils Absolute 0.0 0.0 - 0.1 K/uL  Hemoglobin A1c  Result Value Ref Range   Hgb A1c MFr Bld 7.3 (H) 4.6 - 6.5 %  Lipid panel  Result Value Ref Range   Cholesterol 147 0 - 200 mg/dL   Triglycerides 838.9 (H) 0.0 - 149.0 mg/dL   HDL 72.69 (L) >60.99 mg/dL   VLDL 67.7 0.0 - 59.9 mg/dL   LDL Cholesterol 88 0 - 99 mg/dL   Total CHOL/HDL Ratio 5    NonHDL 119.77   Comprehensive metabolic panel with GFR  Result Value Ref Range   Sodium 137 135 - 145 mEq/L   Potassium 3.8 3.5 - 5.1 mEq/L   Chloride 104 96 - 112 mEq/L   CO2 28 19 - 32 mEq/L   Glucose, Bld 100 (H) 70 - 99 mg/dL   BUN 8 6 - 23 mg/dL   Creatinine, Ser 8.99 0.40 - 1.50 mg/dL   Total Bilirubin 0.7 0.2 - 1.2 mg/dL   Alkaline Phosphatase 80 39 - 117 U/L   AST 21 0 - 37 U/L   ALT 39 0 - 53 U/L   Total Protein 7.2 6.0 - 8.3 g/dL   Albumin 4.1 3.5 - 5.2 g/dL   GFR 10.96 >39.99 mL/min   Calcium  8.7 8.4 - 10.5 mg/dL  VITAMIN D  25 Hydroxy (Vit-D Deficiency, Fractures)  Result  Value Ref Range   VITD 17.74 (L) 30.00 - 100.00 ng/mL  B Nat Peptide  Result Value Ref Range   Pro B Natriuretic peptide (BNP) 76.0 0.0 - 100.0 pg/mL    {Labs (Optional):23779}  The ASCVD Risk score (Arnett DK, et al., 2019) failed to calculate for the following reasons:   Risk score cannot be calculated because patient has a medical history suggesting prior/existing ASCVD    Assessment & Plan:   Problem List Items Addressed This Visit       Cardiovascular and Mediastinum   Primary hypertension - Primary   Relevant Medications   atenolol  (TENORMIN ) 50 MG tablet   atorvastatin  (LIPITOR) 40 MG tablet   losartan -hydrochlorothiazide (HYZAAR) 100-25 MG tablet   Other Relevant Orders   CBC with Differential/Platelet (Completed)   Comprehensive metabolic panel with GFR (Completed)   Microalbumin / creatinine urine ratio   Ambulatory referral to Cardiology     Digestive   Gastroesophageal reflux disease with esophagitis without hemorrhage     Other   History of MI (myocardial infarction)   Relevant Medications   atenolol  (TENORMIN ) 50 MG tablet   Other Relevant Orders   CBC with Differential/Platelet (Completed)   Microalbumin / creatinine urine ratio   B Nat Peptide (Completed)   Ambulatory referral to Cardiology   Mixed hyperlipidemia   Relevant Medications   atenolol  (TENORMIN ) 50 MG tablet   atorvastatin  (LIPITOR) 40 MG tablet   losartan -hydrochlorothiazide (HYZAAR) 100-25 MG tablet   Other Relevant Orders   Lipid panel (Completed)   Microalbumin / creatinine urine ratio   Ambulatory referral to Cardiology   Medication management   Relevant Medications   albuterol  (VENTOLIN  HFA) 108 (90 Base) MCG/ACT inhaler   atenolol  (TENORMIN ) 50 MG tablet   atorvastatin  (LIPITOR) 40 MG tablet   losartan -hydrochlorothiazide (HYZAAR) 100-25 MG tablet   Other Relevant Orders   CBC with Differential/Platelet (Completed)   Hemoglobin A1c (Completed)   Lipid panel (Completed)    Comprehensive metabolic panel with GFR (Completed)   Microalbumin / creatinine  urine ratio   VITAMIN D  25 Hydroxy (Vit-D Deficiency, Fractures) (Completed)   Ambulatory referral to Cardiology   Prediabetes   Relevant Orders   Hemoglobin A1c (Completed)   Microalbumin / creatinine urine ratio   Ambulatory referral to Cardiology   Vitamin D  deficiency   Relevant Orders   VITAMIN D  25 Hydroxy (Vit-D Deficiency, Fractures) (Completed)   Other Visit Diagnoses       Leg swelling       Relevant Orders   B Nat Peptide (Completed)      - Referral placed for cardiology for history of hypertension, hyperlipidemia, and MI.   - The patient was counseled on the dangers of tobacco use, and was advised to quit. Patient states he is not ready to quit at this time. Reviewed strategies to maximize success, including stress management, support of family/friends, and consider smoking cessation aids.     Return in about 3 months (around 04/13/2024).    Debby CHRISTELLA Borer, RN

## 2024-01-15 ENCOUNTER — Ambulatory Visit

## 2024-01-15 ENCOUNTER — Telehealth: Payer: Self-pay

## 2024-01-15 VITALS — BP 140/82 | HR 74 | Ht 69.0 in | Wt 299.0 lb

## 2024-01-15 DIAGNOSIS — I10 Essential (primary) hypertension: Secondary | ICD-10-CM | POA: Diagnosis not present

## 2024-01-15 DIAGNOSIS — R600 Localized edema: Secondary | ICD-10-CM | POA: Insufficient documentation

## 2024-01-15 HISTORY — DX: Localized edema: R60.0

## 2024-01-15 MED ORDER — FUROSEMIDE 40 MG PO TABS: 40.0000 mg | ORAL_TABLET | Freq: Every day | ORAL | 3 refills | Status: DC

## 2024-01-15 NOTE — Assessment & Plan Note (Signed)
 Optimal. Target blood pressure below 130/80 mmHg. For now continue with atenolol  50 mg once daily Continue losartan -hydrochlorothiazide 100 mg - 25 mg once daily. Advised to avoid ibuprofen .  Will start Lasix  as discussed below. Will obtain echocardiogram.

## 2024-01-15 NOTE — Patient Instructions (Signed)
 Medication Instructions:  Your physician has recommended you make the following change in your medication:   START: Lasix  40 mg daily  *If you need a refill on your cardiac medications before your next appointment, please call your pharmacy*  Lab Work: None If you have labs (blood work) drawn today and your tests are completely normal, you will receive your results only by: MyChart Message (if you have MyChart) OR A paper copy in the mail If you have any lab test that is abnormal or we need to change your treatment, we will call you to review the results.  Testing/Procedures: Your physician has requested that you have an echocardiogram. Echocardiography is a painless test that uses sound waves to create images of your heart. It provides your doctor with information about the size and shape of your heart and how well your heart's chambers and valves are working. This procedure takes approximately one hour. There are no restrictions for this procedure. Please do NOT wear cologne, perfume, aftershave, or lotions (deodorant is allowed). Please arrive 15 minutes prior to your appointment time.  Please note: We ask at that you not bring children with you during ultrasound (echo/ vascular) testing. Due to room size and safety concerns, children are not allowed in the ultrasound rooms during exams. Our front office staff cannot provide observation of children in our lobby area while testing is being conducted. An adult accompanying a patient to their appointment will only be allowed in the ultrasound room at the discretion of the ultrasound technician under special circumstances. We apologize for any inconvenience.   Follow-Up: At [facility], you and your health needs are our priority.  As part of our continuing mission to provide you with exceptional heart care, our providers are all part of one team.  This team includes your primary Cardiologist (physician) and Advanced Practice Providers  or APPs (Physician Assistants and Nurse Practitioners) who all work together to provide you with the care you need, when you need it.  Your next appointment:   4 week(s)  Provider:   Alean Kobus, MD    We recommend signing up for the patient portal called MyChart.  Sign up information is provided on this After Visit Summary.  MyChart is used to connect with patients for Virtual Visits (Telemedicine).  Patients are able to view lab/test results, encounter notes, upcoming appointments, etc.  Non-urgent messages can be sent to your provider as well.   To learn more about what you can do with MyChart, go to [email or web address].   Other Instructions None

## 2024-01-15 NOTE — Telephone Encounter (Signed)
 LVM for pt to schedule echo and 4 week follow up with SRM/kbl 01/15/24

## 2024-01-15 NOTE — Progress Notes (Signed)
 Cardiology Consultation:    Date:  01/15/2024   ID:  [name], DOB 09/18/1975, MRN [id number]  PCP:  Alvia Corean CROME, FNP  Cardiologist:  Alean SAUNDERS Lilya Smitherman, MD   Referring MD: Alvia Corean CROME, *   No chief complaint on file.    ASSESSMENT AND PLAN:   Mr. Walter Woods 48 year old male with history of diabetes mellitus, hypertension, hyperlipidemia and smokes tobacco now here for further evaluation of bilateral lower extremity edema that is been going on for many months. Evaluation at [facility] in 2023 for chest pain like symptoms reported no significant abnormalities on stress test to suggest ischemia.   Problem List Items Addressed This Visit     Hypertension - Primary   Optimal. Target blood pressure below 130/80 mmHg. For now continue with atenolol  50 mg once daily Continue losartan -hydrochlorothiazide 100 mg - 25 mg once daily. Advised to avoid ibuprofen .  Will start Lasix  as discussed below. Will obtain echocardiogram.       Relevant Orders   EKG 12-Lead (Completed)   Bilateral lower extremity edema   Highly suggestive of diastolic heart failure. Does have phenotype in the setting of longstanding history of hypertension.  Recommended continue low-salt diet less than 2 g/day and restrict fluid to less than 2 L/day. Will start with diuretic furosemide  40 mg once daily.  Differential diagnosis would be venous insufficiency bilateral lower extremities.   Will obtain transthoracic echocardiogram. Continue aggressive blood pressure control as reviewed above.      Will have him return to office for follow-up in 4 weeks.   History of Present Illness:    Walter Woods is a 48 y.o. male who is being seen today for the evaluation of bilateral lower extremity edema at the request of Alvia Corean CROME, *.  Has history of diabetes mellitus, hypertension, hyperlipidemia, smokes tobacco [was previously smoking up to 4 packs a  day recently quit down to 1 pack in couple days].  Here for the visit today accompanied by his granddaughter. Lives at home with his wife.  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  Has history of stress test with nuclear imaging at [facility] March 2023 that showed no ischemia.  Had hypertensive blood pressure response after exercising 8 minutes 12 seconds on the treadmill attaining 9 METS.  He had resting high blood pressure to begin with.  Elevated blood pressures at baseline at home. With bilateral lower extremity edema going on for many months if not a year has been following up with PCP. Recently had blood work that showed unremarkable proBNP.  Mentions has been on water pill but I do not see any loop diuretic on his medication list.  He will confirm once he goes home.  Denies any chest pain. Does get short of breath after a moderate exertion. Good compliance with medications. Mentions he does not consume salt in the diet.   Smokes tobacco. Drinks alcohol on rare social occasions. No illicit drug use.   Uses Advil  daily for musculoskeletal aches up to once a day 800 mg.  EKG in the clinic today shows sinus rhythm heart rate 74/min, PR interval 158 ms, QRS duration 88 ms, QTc 448 ms.  Nonspecific ST-T changes in inferior leads.  Labs from 01/12/2024 reviewed with elevated hemoglobin A1c, lipid panel suboptimal.  proBNP 76 normal.  Past Medical History:  Diagnosis Date   Asthma    Febrile seizures (HCC)    when child   Gastroesophageal reflux disease with  esophagitis without hemorrhage 10/11/2023   GERD (gastroesophageal reflux disease)    History of diverticulitis    History of MI (myocardial infarction) 09/08/2023   Hyperglycemia 09/08/2023   Hypertension    Leukocytosis 08/06/2019   Medication management 10/11/2023   Mild intermittent asthma without complication 09/08/2023   Mixed hyperlipidemia 09/08/2023   Myocardial infarction [facility])    Pain and  swelling of lower leg 09/08/2023   Prediabetes 01/11/2024   Primary hypertension 09/08/2023   Vitamin D  deficiency 01/11/2024   Weight gain 09/08/2023    Past Surgical History:  Procedure Laterality Date   TESTICLE SURGERY Right    cyst removed     Current Medications: Current Meds  Medication Sig   albuterol  (VENTOLIN  HFA) 108 (90 Base) MCG/ACT inhaler Inhale 2 puffs into the lungs every 6 (six) hours as needed for wheezing or shortness of breath.   atenolol  (TENORMIN ) 50 MG tablet Take 1 tablet (50 mg total) by mouth daily.   atorvastatin  (LIPITOR) 40 MG tablet Take 1 tablet (40 mg total) by mouth daily.   ibuprofen  (ADVIL ,MOTRIN ) 800 MG tablet Take 1 tablet (800 mg total) by mouth 3 (three) times daily.   losartan -hydrochlorothiazide (HYZAAR) 100-25 MG tablet Take 1 tablet by mouth daily.   omeprazole  (PRILOSEC  OTC) 20 MG tablet Take 1 tablet (20 mg total) by mouth daily.   Vitamin D , Ergocalciferol , (DRISDOL ) 1.25 MG (50000 UNIT) CAPS capsule Take 1 capsule (50,000 Units total) by mouth every 7 (seven) days.     Allergies:   Phenobarbital   Social History   Socioeconomic History   Marital status: Married    Spouse name: Not on file   Number of children: 2   Years of education: Not on file   Highest education level: Not on file  Occupational History   Not on file  Tobacco Use   Smoking status: Every Day    Current packs/day: 2.00    Average packs/day: 2.0 packs/day for 34.5 years (69.1 ttl pk-yrs)    Types: Cigarettes    Start date: 07/04/1989   Smokeless tobacco: Never   Tobacco comments:    down to 1/4 ppd  Vaping Use   Vaping status: Never Used  Substance and Sexual Activity   Alcohol use: Yes    Alcohol/week: 3.0 standard drinks of alcohol    Types: 3 Cans of beer per week   Drug use: Never   Sexual activity: Not on file  Other Topics Concern   Not on file  Social History Narrative   Not on file   Social Drivers of Health   Financial Resource Strain:  Not on file  Food Insecurity: Not on file  Transportation Needs: Not on file  Physical Activity: Not on file  Stress: Not on file  Social Connections: Not on file     Family History: The patient's family history includes Anemia in his mother; Cervical cancer in his maternal grandmother; Thyroid  disease in his mother. ROS:   Please see the history of present illness.    All 14 point review of systems negative except as described per history of present illness.  EKGs/Labs/Other Studies Reviewed:    The following studies were reviewed today:   EKG:  EKG Interpretation Date/Time:  Monday January 15 2024 10:53:55 EDT Ventricular Rate:  74 PR Interval:  158 QRS Duration:  88 QT Interval:  404 QTC Calculation: 448 R Axis:   44  Text Interpretation: Normal sinus rhythm Normal ECG When compared with ECG of 30-Jun-2015  23:19, Nonspecific T wave abnormality now evident in Inferior leads Confirmed by Liborio Hai reddy [phone]) on 01/15/2024 11:37:21 AM    Recent Labs: 09/08/2023: TSH 3.07 01/12/2024: ALT 39; BUN 8; Creatinine, Ser 1.00; Hemoglobin 15.3; Platelets 294.0; Potassium 3.8; Pro B Natriuretic peptide (BNP) 76.0; Sodium 137  Recent Lipid Panel    Component Value Date/Time   CHOL 147 01/12/2024 1113   TRIG 161.0 (H) 01/12/2024 1113   HDL 27.30 (L) 01/12/2024 1113   CHOLHDL 5 01/12/2024 1113   VLDL 32.2 01/12/2024 1113   LDLCALC 88 01/12/2024 1113    Physical Exam:    VS:  BP (!) 140/82   Pulse 74   Ht 5' 9 (1.753 m)   Wt 299 lb (135.6 kg)   SpO2 96%   BMI 44.15 kg/m     Wt Readings from Last 3 Encounters:  01/15/24 299 lb (135.6 kg)  01/12/24 (!) 304 lb 8 oz (138.1 kg)  10/06/23 (!) 303 lb 3.2 oz (137.5 kg)     GENERAL:  Well nourished, well developed in no acute distress NECK: No JVD; No carotid bruits CARDIAC: RRR, S1 and S2 present, no murmurs, no rubs, no gallops CHEST:  Clear to auscultation without rales, wheezing or rhonchi  Extremities: Bilateral  2+ pitting pedal edema extending up to the knees.  Pulses bilaterally symmetric with radial 2+ and dorsalis pedis 2+ NEUROLOGIC:  Alert and oriented x 3  Medication Adjustments/Labs and Tests Ordered: Current medicines are reviewed at length with the patient today.  Concerns regarding medicines are outlined above.  Orders Placed This Encounter  Procedures   EKG 12-Lead   No orders of the defined types were placed in this encounter.   Signed, Hai jess Liborio, MD, MPH, [facility]. 01/15/2024 11:56 AM    [facility]

## 2024-01-15 NOTE — Assessment & Plan Note (Signed)
 Highly suggestive of diastolic heart failure. Does have phenotype in the setting of longstanding history of hypertension.  Recommended continue low-salt diet less than 2 g/day and restrict fluid to less than 2 L/day. Will start with diuretic furosemide  40 mg once daily.  Differential diagnosis would be venous insufficiency bilateral lower extremities.   Will obtain transthoracic echocardiogram. Continue aggressive blood pressure control as reviewed above.

## 2024-01-15 NOTE — Addendum Note (Signed)
 Addended by: SHERRE ADE I on: 01/15/2024 12:07 PM   Modules accepted: Orders

## 2024-01-16 DIAGNOSIS — Z716 Tobacco abuse counseling: Secondary | ICD-10-CM

## 2024-01-16 DIAGNOSIS — M7989 Other specified soft tissue disorders: Secondary | ICD-10-CM | POA: Insufficient documentation

## 2024-01-16 HISTORY — DX: Other specified soft tissue disorders: M79.89

## 2024-01-16 HISTORY — DX: Tobacco abuse counseling: Z71.6

## 2024-01-17 NOTE — Telephone Encounter (Signed)
 Patient d/c'd script.

## 2024-01-18 NOTE — Telephone Encounter (Signed)
 Patient scheduled.

## 2024-01-22 ENCOUNTER — Telehealth: Payer: Self-pay

## 2024-01-22 ENCOUNTER — Ambulatory Visit: Payer: Self-pay

## 2024-01-22 NOTE — Telephone Encounter (Signed)
 FYI Only or Action Required?: Action required by provider: clinical question for provider. Wife asking for intervention for blood glucose level while waiting for Mounjaro   Patient was last seen in primary care on 01/12/2024 by Alvia Corean CROME, FNP.  Called Nurse Triage reporting Blood Sugar Problem. BG 215 after eating some cheesecake. Patient like he was sitting sideways; denies dizziness  Symptoms began today.  Interventions attempted: Nothing. Wife asking if she can dose patient with some of her insulin  Symptoms are: stable.  Triage Disposition: See Physician Within 24 Hours-   Patient/caregiver understands and will follow disposition?: No, wishes to speak with PCP; Care advice given to include to drink 8oz of water per hour for the next 4 hours. Wife wants to know if she can give patient some of her insulin., This RN advised her not to and to wait for callback.   Copied from CRM [id number]. Topic: Clinical - Red Word Triage >> Jan 22, 2024  4:34 PM Lavanda D wrote: Red Word that prompted transfer to Nurse Triage: High blood sugar, 215 He took 2 bites of a piece cheesecake, he said it feels like the room is sitting sideways. Fruity breath. Reason for Disposition  [1] Symptoms of high blood sugar (e.g., increased thirst, frequent urination, weight loss) AND [2] not able to test blood glucose  Answer Assessment - Initial Assessment Questions 1. BLOOD GLUCOSE: What is your blood glucose level?      215   2. ONSET: When did you check the blood glucose?     Checked  3. USUAL RANGE: What is your glucose level usually? (e.g., usual fasting morning value, usual evening value)     Unsure of range, was just diagnosed with DM  4. KETONES: Do you check for ketones (urine or blood test strips)? If Yes, ask: What does the test show now?      No  5. TYPE 1 or 2:  Do you know what type of diabetes you have?  (e.g., Type 1, Type 2, Gestational; doesn't know)      *No  Answer* 6. INSULIN: Do you take insulin? What type of insulin(s) do you use? What is the mode of delivery? (syringe, pen; injection or pump)?      No  7. DIABETES PILLS: Do you take any pills for your diabetes? If Yes, ask: Have you missed taking any pills recently?     No  8. OTHER SYMPTOMS: Do you have any symptoms? (e.g., fever, frequent urination, difficulty breathing, dizziness, weakness, vomiting)     Felt like he was sitting sideways. Earlier today he had blurred vision bbut felt better after drinking mountain  9. PREGNANCY: Is there any chance you are pregnant? When was your last menstrual period?     *No Answer*  Protocols used: Diabetes - High Blood Sugar-A-AH

## 2024-01-22 NOTE — Telephone Encounter (Unsigned)
 Copied from CRM [id number]. Topic: Clinical - Medication Prior Auth >> Jan 22, 2024  4:22 PM [place] J wrote: Reason for CRM: Patient's wife is calling in because the pharmacy stated that his Mounjaro  2.5MG /0.5ML is needing a prior authorization. ----------------------------------------------------------------------- From previous Reason for Contact - Prescription Issue: Reason for CRM:

## 2024-01-23 ENCOUNTER — Telehealth: Payer: Self-pay

## 2024-01-23 ENCOUNTER — Other Ambulatory Visit: Payer: Self-pay

## 2024-01-23 ENCOUNTER — Other Ambulatory Visit: Payer: Self-pay | Admitting: Family Medicine

## 2024-01-23 ENCOUNTER — Other Ambulatory Visit (HOSPITAL_COMMUNITY): Payer: Self-pay

## 2024-01-23 DIAGNOSIS — E1159 Type 2 diabetes mellitus with other circulatory complications: Secondary | ICD-10-CM

## 2024-01-23 MED ORDER — BLOOD GLUCOSE MONITORING SUPPL DEVI: 1.0000 | Freq: Three times a day (TID) | 0 refills | Status: AC

## 2024-01-23 MED ORDER — LANCET DEVICE MISC: 1.0000 | Freq: Three times a day (TID) | 0 refills | Status: AC

## 2024-01-23 MED ORDER — BLOOD GLUCOSE TEST VI STRP: 1.0000 | ORAL_STRIP | Freq: Two times a day (BID) | 0 refills | Status: AC

## 2024-01-23 MED ORDER — LANCETS MISC. MISC: 1.0000 | Freq: Three times a day (TID) | 0 refills | Status: AC

## 2024-01-23 MED ORDER — METFORMIN HCL 500 MG PO TABS: 500.0000 mg | ORAL_TABLET | Freq: Two times a day (BID) | ORAL | 0 refills | Status: DC

## 2024-01-23 NOTE — Telephone Encounter (Signed)
 Spoke with patient, advised him PA has been submitted. PCP sent in Metformin  for him to trial until Mounjaro  PA comes back. Let him know Metformin  may cause diarrhea and or Nausea. Also placed order for patient to have his own Glucose meter as he has been using his wife and daughters meters. Patient expressed understanding.

## 2024-01-23 NOTE — Telephone Encounter (Signed)
 Please initiate PA for patients Mounjaro 

## 2024-01-23 NOTE — Telephone Encounter (Signed)
 Pharmacy Patient Advocate Encounter   Received notification from Physician's Office that prior authorization for Mounjaro  2.5mg /0.76ml is required/requested.   Insurance verification completed.   The patient is insured through [facility] .   Per test claim: PA required; PA submitted to above mentioned insurance via Fax Key/confirmation #/EOC -- Status is pending   Phone# [phone] Fax# [phone]

## 2024-01-24 ENCOUNTER — Other Ambulatory Visit (HOSPITAL_COMMUNITY): Payer: Self-pay

## 2024-01-25 ENCOUNTER — Other Ambulatory Visit (HOSPITAL_COMMUNITY): Payer: Self-pay

## 2024-01-25 NOTE — Telephone Encounter (Signed)
 LVM for patient to return call.

## 2024-01-25 NOTE — Telephone Encounter (Signed)
 Placed a call to the insurance to check the status of the PA. Per the rep, she said it is in review status and can take up to 15 business days. Requested for an urgent PA and the turn around time is 72h/3 business days.

## 2024-01-25 NOTE — Telephone Encounter (Signed)
 Pharmacy Patient Advocate Encounter  Received notification from [facility] that Prior Authorization for Mounjaro  2.5mg /0.11ml has been DENIED.  Full denial letter will be uploaded to the media tab. See denial reason below.   PA #/Case ID/Reference #: [id number]

## 2024-01-29 ENCOUNTER — Other Ambulatory Visit (HOSPITAL_COMMUNITY): Payer: Self-pay

## 2024-01-29 ENCOUNTER — Telehealth: Payer: Self-pay

## 2024-01-29 NOTE — Telephone Encounter (Signed)
 Pharmacy Patient Advocate Encounter   Received notification from CoverMyMeds that prior authorization for Mounjaro  2.5 is required/requested.   Insurance verification completed.   The patient is insured through [facility] .   Per test claim: PA required; PA submitted to above mentioned insurance via CoverMyMeds Key/confirmation #/EOC [id number] Status is pending

## 2024-02-01 NOTE — Telephone Encounter (Signed)
 Update

## 2024-02-02 ENCOUNTER — Other Ambulatory Visit (HOSPITAL_COMMUNITY): Payer: Self-pay

## 2024-02-02 NOTE — Telephone Encounter (Signed)
 Pharmacy Patient Advocate Encounter  Received notification from [facility] that Prior Authorization for Mounjaro  2.5 has been DENIED.  Full denial letter will be uploaded to the media tab. See denial reason below.     PA #/Case ID/Reference #: [id number]

## 2024-02-07 ENCOUNTER — Ambulatory Visit

## 2024-02-07 DIAGNOSIS — I10 Essential (primary) hypertension: Secondary | ICD-10-CM | POA: Diagnosis not present

## 2024-02-07 DIAGNOSIS — R600 Localized edema: Secondary | ICD-10-CM

## 2024-02-07 LAB — ECHOCARDIOGRAM COMPLETE
AR max vel: 2.65 cm2
AV Area VTI: 2.55 cm2
AV Area mean vel: 2.42 cm2
AV Mean grad: 3 mmHg
AV Peak grad: 6 mmHg
Ao pk vel: 1.22 m/s
Area-P 1/2: 2.64 cm2
MV VTI: 1.27 cm2
S' Lateral: 3.2 cm

## 2024-02-09 ENCOUNTER — Ambulatory Visit: Payer: Self-pay

## 2024-03-01 ENCOUNTER — Ambulatory Visit

## 2024-03-01 NOTE — Progress Notes (Deleted)
 Cardiology Consultation:    Date:  03/01/2024   ID:  Walter Woods, DOB 1976/04/18, MRN [id number]  PCP:  Alvia Corean CROME, FNP  Cardiologist:  Alean SAUNDERS Gaylin Osoria, MD   Referring MD: Alvia Corean CROME, *   No chief complaint on file.    ASSESSMENT AND PLAN:   Walter Woods 48 year old male  Problem List Items Addressed This Visit   None     History of Present Illness:    Walter Woods is a 48 y.o. male who is being seen today for follow-up visit. PCP is Alvia Corean CROME, *. Last visit with me in the office was 01/15/2024.  Has history of diabetes mellitus, hypertension, hyperlipidemia, smokes tobacco [previously up to 4 packs a day, has been cutting down], prior evaluation for chest pain in 2023 with Lexiscan stress with nuclear imaging in 2023 at [facility] noted no ischemia.  Seen for further evaluation of bilateral lower extremity edema going on for months.    Past Medical History:  Diagnosis Date   Asthma    Bilateral lower extremity edema 01/15/2024   Febrile seizures (HCC)    when child   Gastroesophageal reflux disease with esophagitis without hemorrhage 10/11/2023   GERD (gastroesophageal reflux disease)    History of diverticulitis    History of MI (myocardial infarction) 09/08/2023   Hyperglycemia 09/08/2023   Hypertension    Leg swelling 01/16/2024   Leukocytosis 08/06/2019   Medication management 10/11/2023   Mild intermittent asthma without complication 09/08/2023   Mixed hyperlipidemia 09/08/2023   Myocardial infarction (HCC)    Pain and swelling of lower leg 09/08/2023   Prediabetes 01/11/2024   Primary hypertension 09/08/2023   Tobacco abuse counseling 01/16/2024   Vitamin D  deficiency 01/11/2024   Weight gain 09/08/2023    Past Surgical History:  Procedure Laterality Date   TESTICLE SURGERY Right    cyst removed     Current Medications: No outpatient medications have been marked as taking for the  03/01/24 encounter (Appointment) with Carman Auxier, Alean SAUNDERS, MD.     Allergies:   Phenobarbital   Social History   Socioeconomic History   Marital status: Married    Spouse name: Not on file   Number of children: 2   Years of education: Not on file   Highest education level: Not on file  Occupational History   Not on file  Tobacco Use   Smoking status: Every Day    Current packs/day: 2.00    Average packs/day: 2.0 packs/day for 34.7 years (69.3 ttl pk-yrs)    Types: Cigarettes    Start date: 07/04/1989   Smokeless tobacco: Never   Tobacco comments:    down to 1/4 ppd  Vaping Use   Vaping status: Never Used  Substance and Sexual Activity   Alcohol use: Yes    Alcohol/week: 3.0 standard drinks of alcohol    Types: 3 Cans of beer per week   Drug use: Never   Sexual activity: Not on file  Other Topics Concern   Not on file  Social History Narrative   Not on file   Social Drivers of Health   Financial Resource Strain: Not on file  Food Insecurity: Not on file  Transportation Needs: Not on file  Physical Activity: Not on file  Stress: Not on file  Social Connections: Not on file     Family History: The patient's family history includes Anemia in his mother; Cervical cancer in his maternal grandmother; Thyroid  disease  in his mother. ROS:   Please see the history of present illness.    All 14 point review of systems negative except as described per history of present illness.  EKGs/Labs/Other Studies Reviewed:    The following studies were reviewed today:   EKG:       Recent Labs: 09/08/2023: TSH 3.07 01/12/2024: ALT 39; BUN 8; Creatinine, Ser 1.00; Hemoglobin 15.3; Platelets 294.0; Potassium 3.8; Pro B Natriuretic peptide (BNP) 76.0; Sodium 137  Recent Lipid Panel    Component Value Date/Time   CHOL 147 01/12/2024 1113   TRIG 161.0 (H) 01/12/2024 1113   HDL 27.30 (L) 01/12/2024 1113   CHOLHDL 5 01/12/2024 1113   VLDL 32.2 01/12/2024 1113   LDLCALC 88  01/12/2024 1113    Physical Exam:    VS:  There were no vitals taken for this visit.    Wt Readings from Last 3 Encounters:  01/15/24 299 lb (135.6 kg)  01/12/24 (!) 304 lb 8 oz (138.1 kg)  10/06/23 (!) 303 lb 3.2 oz (137.5 kg)     GENERAL:  Well nourished, well developed in no acute distress NECK: No JVD; No carotid bruits CARDIAC: RRR, S1 and S2 present, no murmurs, no rubs, no gallops CHEST:  Clear to auscultation without rales, wheezing or rhonchi  Extremities: No pitting pedal edema. Pulses bilaterally symmetric with radial 2+ and dorsalis pedis 2+ NEUROLOGIC:  Alert and oriented x 3  Medication Adjustments/Labs and Tests Ordered: Current medicines are reviewed at length with the patient today.  Concerns regarding medicines are outlined above.  No orders of the defined types were placed in this encounter.  No orders of the defined types were placed in this encounter.   Signed, Alean jess Kobus, MD, MPH, [facility]. 03/01/2024 12:44 PM    [facility]

## 2024-03-07 ENCOUNTER — Ambulatory Visit

## 2024-03-07 VITALS — BP 128/68 | HR 86 | Ht 69.0 in | Wt 290.0 lb

## 2024-03-07 DIAGNOSIS — R600 Localized edema: Secondary | ICD-10-CM | POA: Diagnosis not present

## 2024-03-07 DIAGNOSIS — I10 Essential (primary) hypertension: Secondary | ICD-10-CM | POA: Diagnosis not present

## 2024-03-07 MED ORDER — FUROSEMIDE 40 MG PO TABS: 40.0000 mg | ORAL_TABLET | Freq: Every day | ORAL | 3 refills | Status: AC

## 2024-03-07 NOTE — Assessment & Plan Note (Signed)
 BMI 42.8. With uncontrolled hypertension. Likely contributing to his bilateral lower extremity edema  Advised for lifestyle modifications to target weight loss.

## 2024-03-07 NOTE — Assessment & Plan Note (Signed)
 No obvious diastolic dysfunction or valvular dysfunction on transthoracic echocardiogram August 2025. Bilateral lower extremity edema likely related to obesity and venous insufficiency.  Advised to continue with aggressive lifestyle modification dietary sodium control less than 2 g/day and optimization of blood pressure control.  Continue with furosemide  titrate the dose up by continuing 40 mg once daily in the morning and an additional 40 mg in the afternoon every other day.  Will repeat blood work with basic metabolic panel and magnesium  to follow-up on electrolytes and kidney function on the current dose of Lasix .

## 2024-03-07 NOTE — Assessment & Plan Note (Signed)
 Well-controlled. Target blood pressure below 130/80 mmHg. Continue with atenolol  50 mg once daily. Continue losartan -hydrochlorothiazide 100 mg - 25 mg once daily Continue furosemide , dose adjusted to 40 mg every day in the morning and an additional 40 mg every other day in the afternoon.

## 2024-03-07 NOTE — Progress Notes (Signed)
 Cardiology Consultation:    Date:  03/07/2024   ID:  Walter Woods, DOB 1976-01-16, MRN [id number]  PCP:  Alvia Corean CROME, FNP  Cardiologist:  Alean SAUNDERS Gloriana Piltz, MD   Referring MD: Alvia Corean CROME, *   No chief complaint on file.    ASSESSMENT AND PLAN:   Mr. Walter Woods 48 year old male history of diabetes mellitus, hypertension, hyperlipidemia, smokes tobacco [previously up to 4 packs a day, has been cutting down], prior evaluation for chest pain in 2023 with Lexiscan stress with nuclear imaging in 2023 at [facility] noted no ischemia.   Seen for further evaluation of bilateral lower extremity edema going on for months. Without significant abnormality of proBNP measuring 76 on blood work 01/12/2024. Echocardiogram completed 02/07/2024 noted LVEF 60 to 65%, mild concentric LVH, diastolic diameters appear normal.  No significant valve abnormalities.    Problem List Items Addressed This Visit     Primary hypertension - Primary   Relevant Medications   furosemide  (LASIX ) 40 MG tablet   Other Relevant Orders   Basic metabolic panel with GFR   Magnesium    Bilateral lower extremity edema   No obvious diastolic dysfunction or valvular dysfunction on transthoracic echocardiogram August 2025. Bilateral lower extremity edema likely related to obesity and venous insufficiency.  Advised to continue with aggressive lifestyle modification dietary sodium control less than 2 g/day and optimization of blood pressure control.  Continue with furosemide  titrate the dose up by continuing 40 mg once daily in the morning and an additional 40 mg in the afternoon every other day.  Will repeat blood work with basic metabolic panel and magnesium  to follow-up on electrolytes and kidney function on the current dose of Lasix .       Morbid obesity (HCC)   BMI 42.8. With uncontrolled hypertension. Likely contributing to his bilateral lower extremity edema  Advised for  lifestyle modifications to target weight loss.          History of Present Illness:    Walter Woods is a 48 y.o. male who is being seen today for follow-up visit. PCP is Alvia Corean CROME, *. Last visit with me in the office was 01/15/2024.  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  Has history of diabetes mellitus, hypertension, hyperlipidemia, smokes tobacco [previously up to 4 packs a day, has been cutting down], prior evaluation for chest pain in 2023 with Lexiscan stress with nuclear imaging in 2023 at [facility] noted no ischemia.  Seen for further evaluation of bilateral lower extremity edema going on for months. With proBNP 76 on blood work 01/12/2024. Echocardiogram completed 02/07/2024 noted LVEF 60 to 65%, mild concentric LVH, diastolic diameters appear normal.  No significant valve abnormalities.  Started him on furosemide  40 mg once daily at last office visit and will obtain transthoracic echocardiogram.  Mentions overall his swelling has improved. Mentions weight has improved. Was 299 pounds at last office visit on July 14. Weight down to 290 pounds today. Good compliance with his medications and no side effects with his current medications.  Has been cutting down on smoking.  Past Medical History:  Diagnosis Date   Asthma    Bilateral lower extremity edema 01/15/2024   Febrile seizures (HCC)    when child   Gastroesophageal reflux disease with esophagitis without hemorrhage 10/11/2023   GERD (gastroesophageal reflux disease)    History of diverticulitis    History of MI (myocardial infarction) 09/08/2023   Hyperglycemia 09/08/2023  Hypertension    Leg swelling 01/16/2024   Leukocytosis 08/06/2019   Medication management 10/11/2023   Mild intermittent asthma without complication 09/08/2023   Mixed hyperlipidemia 09/08/2023   Myocardial infarction [facility])    Pain and swelling of lower leg 09/08/2023    Prediabetes 01/11/2024   Primary hypertension 09/08/2023   Tobacco abuse counseling 01/16/2024   Vitamin D  deficiency 01/11/2024   Weight gain 09/08/2023    Past Surgical History:  Procedure Laterality Date   TESTICLE SURGERY Right    cyst removed     Current Medications: Current Meds  Medication Sig   albuterol  (VENTOLIN  HFA) 108 (90 Base) MCG/ACT inhaler Inhale 2 puffs into the lungs every 6 (six) hours as needed for wheezing or shortness of breath.   atenolol  (TENORMIN ) 50 MG tablet Take 1 tablet (50 mg total) by mouth daily.   atorvastatin  (LIPITOR) 40 MG tablet Take 1 tablet (40 mg total) by mouth daily.   Blood Glucose Monitoring Suppl DEVI 1 each by Does not apply route in the morning, at noon, and at bedtime. May substitute to any manufacturer covered by patient's insurance.   Glucose Blood (BLOOD GLUCOSE TEST STRIPS) STRP 1 each by In Vitro route 2 (two) times daily. May substitute to any manufacturer covered by patient's insurance.   ibuprofen  (ADVIL ,MOTRIN ) 800 MG tablet Take 1 tablet (800 mg total) by mouth 3 (three) times daily.   losartan -hydrochlorothiazide (HYZAAR) 100-25 MG tablet Take 1 tablet by mouth daily.   metFORMIN  (GLUCOPHAGE ) 500 MG tablet Take 1 tablet (500 mg total) by mouth 2 (two) times daily with a meal.   omeprazole  (PRILOSEC  OTC) 20 MG tablet Take 1 tablet (20 mg total) by mouth daily.   Vitamin D , Ergocalciferol , (DRISDOL ) 1.25 MG (50000 UNIT) CAPS capsule Take 1 capsule (50,000 Units total) by mouth every 7 (seven) days.   [DISCONTINUED] furosemide  (LASIX ) 40 MG tablet Take 1 tablet (40 mg total) by mouth daily.     Allergies:   Phenobarbital   Social History   Socioeconomic History   Marital status: Married    Spouse name: Not on file   Number of children: 2   Years of education: Not on file   Highest education level: Not on file  Occupational History   Not on file  Tobacco Use   Smoking status: Every Day    Current packs/day: 2.00     Average packs/day: 2.0 packs/day for 34.7 years (69.3 ttl pk-yrs)    Types: Cigarettes    Start date: 07/04/1989   Smokeless tobacco: Never   Tobacco comments:    down to 1/4 ppd  Vaping Use   Vaping status: Never Used  Substance and Sexual Activity   Alcohol use: Yes    Alcohol/week: 3.0 standard drinks of alcohol    Types: 3 Cans of beer per week   Drug use: Never   Sexual activity: Not on file  Other Topics Concern   Not on file  Social History Narrative   Not on file   Social Drivers of Health   Financial Resource Strain: Not on file  Food Insecurity: Not on file  Transportation Needs: Not on file  Physical Activity: Not on file  Stress: Not on file  Social Connections: Not on file     Family History: The patient's family history includes Anemia in his mother; Cervical cancer in his maternal grandmother; Thyroid  disease in his mother. ROS:   Please see the history of present illness.    All 14  point review of systems negative except as described per history of present illness.  EKGs/Labs/Other Studies Reviewed:    The following studies were reviewed today:   EKG:       Recent Labs: 09/08/2023: TSH 3.07 01/12/2024: ALT 39; BUN 8; Creatinine, Ser 1.00; Hemoglobin 15.3; Platelets 294.0; Potassium 3.8; Pro B Natriuretic peptide (BNP) 76.0; Sodium 137  Recent Lipid Panel    Component Value Date/Time   CHOL 147 01/12/2024 1113   TRIG 161.0 (H) 01/12/2024 1113   HDL 27.30 (L) 01/12/2024 1113   CHOLHDL 5 01/12/2024 1113   VLDL 32.2 01/12/2024 1113   LDLCALC 88 01/12/2024 1113    Physical Exam:    VS:  BP 128/68   Pulse 86   Ht 5' 9 (1.753 m)   Wt 290 lb (131.5 kg)   SpO2 98%   BMI 42.83 kg/m     Wt Readings from Last 3 Encounters:  03/07/24 290 lb (131.5 kg)  01/15/24 299 lb (135.6 kg)  01/12/24 (!) 304 lb 8 oz (138.1 kg)     GENERAL:  Well nourished, well developed in no acute distress NECK: No JVD; No carotid bruits CARDIAC: RRR, S1 and S2 present,  no murmurs, no rubs, no gallops CHEST:  Clear to auscultation without rales, wheezing or rhonchi  Extremities: 1+ bilateral pitting pedal edema extending slightly above the ankles.  Pulses bilaterally symmetric with radial 2+ and dorsalis pedis 2+ NEUROLOGIC:  Alert and oriented x 3  Medication Adjustments/Labs and Tests Ordered: Current medicines are reviewed at length with the patient today.  Concerns regarding medicines are outlined above.  Orders Placed This Encounter  Procedures   Basic metabolic panel with GFR   Magnesium    Meds ordered this encounter  Medications   furosemide  (LASIX ) 40 MG tablet    Sig: Take 1 tablet (40 mg total) by mouth daily. Take an additional 40 mg every other afternoon.    Dispense:  135 tablet    Refill:  3    Signed, Konni Kesinger reddy Tya Haughey, MD, MPH, [facility]. 03/07/2024 5:07 PM    [facility]

## 2024-03-07 NOTE — Patient Instructions (Signed)
 Medication Instructions:  Your physician has recommended you make the following change in your medication:   Increase your Furosemide  to 40 mg daily and an extra 40 mg every other afternoon   *If you need a refill on your cardiac medications before your next appointment, please call your pharmacy*   Lab Work: Your physician recommends that you return for lab work in: tomorrow for [employer] and magnesium  You need to have labs done when you are fasting.  You can come Monday through Friday 8:30 am to 12:00 pm and 1:15 to 4:30. You do not need to make an appointment as the order has already been placed.  If you have labs (blood work) drawn today and your tests are completely normal, you will receive your results only by: MyChart Message (if you have MyChart) OR A paper copy in the mail If you have any lab test that is abnormal or we need to change your treatment, we will call you to review the results.   Testing/Procedures: None ordered   Follow-Up: At [facility], you and your health needs are our priority.  As part of our continuing mission to provide you with exceptional heart care, we have created designated Provider Care Teams.  These Care Teams include your primary Cardiologist (physician) and Advanced Practice Providers (APPs -  Physician Assistants and Nurse Practitioners) who all work together to provide you with the care you need, when you need it.  We recommend signing up for the patient portal called MyChart.  Sign up information is provided on this After Visit Summary.  MyChart is used to connect with patients for Virtual Visits (Telemedicine).  Patients are able to view lab/test results, encounter notes, upcoming appointments, etc.  Non-urgent messages can be sent to your provider as well.   To learn more about what you can do with MyChart, go to [email or web address].    Your next appointment:   3 month(s)  The format for your next appointment:   In  Person  Provider:   Alean Kobus, MD    Other Instructions none  Important Information About Sugar

## 2024-03-09 LAB — BASIC METABOLIC PANEL WITH GFR
BUN/Creatinine Ratio: 10 (ref 9–20)
BUN: 11 mg/dL (ref 6–24)
CO2: 23 mmol/L (ref 20–29)
Calcium: 9.4 mg/dL (ref 8.7–10.2)
Chloride: 97 mmol/L (ref 96–106)
Creatinine, Ser: 1.05 mg/dL (ref 0.76–1.27)
Glucose: 77 mg/dL (ref 70–99)
Potassium: 3.8 mmol/L (ref 3.5–5.2)
Sodium: 138 mmol/L (ref 134–144)
eGFR: 88 mL/min/1.73 (ref >59)

## 2024-03-09 LAB — MAGNESIUM: Magnesium: 2.1 mg/dL (ref 1.6–2.3)

## 2024-03-12 ENCOUNTER — Ambulatory Visit: Payer: Self-pay

## 2024-04-05 ENCOUNTER — Encounter: Payer: Self-pay | Admitting: Family Medicine

## 2024-04-05 ENCOUNTER — Ambulatory Visit: Admitting: Family Medicine

## 2024-04-05 ENCOUNTER — Ambulatory Visit (INDEPENDENT_AMBULATORY_CARE_PROVIDER_SITE_OTHER)

## 2024-04-05 VITALS — BP 132/80 | HR 66 | Temp 98.3°F | Ht 69.0 in | Wt 294.2 lb

## 2024-04-05 DIAGNOSIS — R062 Wheezing: Secondary | ICD-10-CM

## 2024-04-05 DIAGNOSIS — R051 Acute cough: Secondary | ICD-10-CM

## 2024-04-05 DIAGNOSIS — Z79899 Other long term (current) drug therapy: Secondary | ICD-10-CM

## 2024-04-05 DIAGNOSIS — E1165 Type 2 diabetes mellitus with hyperglycemia: Secondary | ICD-10-CM | POA: Diagnosis not present

## 2024-04-05 DIAGNOSIS — R42 Dizziness and giddiness: Secondary | ICD-10-CM

## 2024-04-05 DIAGNOSIS — I10 Essential (primary) hypertension: Secondary | ICD-10-CM

## 2024-04-05 DIAGNOSIS — Z23 Encounter for immunization: Secondary | ICD-10-CM | POA: Diagnosis not present

## 2024-04-05 DIAGNOSIS — E782 Mixed hyperlipidemia: Secondary | ICD-10-CM | POA: Diagnosis not present

## 2024-04-05 DIAGNOSIS — Z7984 Long term (current) use of oral hypoglycemic drugs: Secondary | ICD-10-CM

## 2024-04-05 DIAGNOSIS — K2100 Gastro-esophageal reflux disease with esophagitis, without bleeding: Secondary | ICD-10-CM

## 2024-04-05 DIAGNOSIS — E559 Vitamin D deficiency, unspecified: Secondary | ICD-10-CM

## 2024-04-05 DIAGNOSIS — J4541 Moderate persistent asthma with (acute) exacerbation: Secondary | ICD-10-CM

## 2024-04-05 LAB — COMPREHENSIVE METABOLIC PANEL WITH GFR
ALT: 44 U/L (ref 0–53)
AST: 28 U/L (ref 0–37)
Albumin: 4.1 g/dL (ref 3.5–5.2)
Alkaline Phosphatase: 78 U/L (ref 39–117)
BUN: 10 mg/dL (ref 6–23)
CO2: 27 meq/L (ref 19–32)
Calcium: 9.4 mg/dL (ref 8.4–10.5)
Chloride: 100 meq/L (ref 96–112)
Creatinine, Ser: 1.04 mg/dL (ref 0.40–1.50)
GFR: 84.8 mL/min (ref >60.00)
Glucose, Bld: 76 mg/dL (ref 70–99)
Potassium: 3.9 meq/L (ref 3.5–5.1)
Sodium: 137 meq/L (ref 135–145)
Total Bilirubin: 0.7 mg/dL (ref 0.2–1.2)
Total Protein: 6.9 g/dL (ref 6.0–8.3)

## 2024-04-05 LAB — CBC WITH DIFFERENTIAL/PLATELET
Basophils Absolute: 0.1 K/uL (ref 0.0–0.1)
Basophils Relative: 0.4 % (ref 0.0–3.0)
Eosinophils Absolute: 0.3 K/uL (ref 0.0–0.7)
Eosinophils Relative: 2.3 % (ref 0.0–5.0)
HCT: 45 % (ref 39.0–52.0)
Hemoglobin: 15.1 g/dL (ref 13.0–17.0)
Lymphocytes Relative: 18 % (ref 12.0–46.0)
Lymphs Abs: 2.5 K/uL (ref 0.7–4.0)
MCHC: 33.6 g/dL (ref 30.0–36.0)
MCV: 92.5 fl (ref 78.0–100.0)
Monocytes Absolute: 1.2 K/uL — ABNORMAL HIGH (ref 0.1–1.0)
Monocytes Relative: 8.9 % (ref 3.0–12.0)
Neutro Abs: 9.7 K/uL — ABNORMAL HIGH (ref 1.4–7.7)
Neutrophils Relative %: 70.4 % (ref 43.0–77.0)
Platelets: 323 K/uL (ref 150.0–400.0)
RBC: 4.87 Mil/uL (ref 4.22–5.81)
RDW: 13.9 % (ref 11.5–15.5)
WBC: 13.8 K/uL — ABNORMAL HIGH (ref 4.0–10.5)

## 2024-04-05 LAB — LIPID PANEL
Cholesterol: 122 mg/dL (ref 0–200)
HDL: 26.9 mg/dL — ABNORMAL LOW (ref >39.00)
LDL Cholesterol: 70 mg/dL (ref 0–99)
NonHDL: 95.02
Total CHOL/HDL Ratio: 5
Triglycerides: 124 mg/dL (ref 0.0–149.0)
VLDL: 24.8 mg/dL (ref 0.0–40.0)

## 2024-04-05 LAB — VITAMIN B12: Vitamin B-12: 213 pg/mL (ref 211–911)

## 2024-04-05 LAB — HEMOGLOBIN A1C: Hgb A1c MFr Bld: 7 % — ABNORMAL HIGH (ref 4.6–6.5)

## 2024-04-05 LAB — VITAMIN D 25 HYDROXY (VIT D DEFICIENCY, FRACTURES): VITD: 11.17 ng/mL — ABNORMAL LOW (ref 30.00–100.00)

## 2024-04-05 LAB — TSH: TSH: 2.43 u[IU]/mL (ref 0.35–5.50)

## 2024-04-05 MED ORDER — ALBUTEROL SULFATE HFA 108 (90 BASE) MCG/ACT IN AERS: 2.0000 | INHALATION_SPRAY | Freq: Four times a day (QID) | RESPIRATORY_TRACT | 3 refills | Status: AC | PRN

## 2024-04-05 MED ORDER — PIOGLITAZONE HCL 15 MG PO TABS
15.0000 mg | ORAL_TABLET | Freq: Every day | ORAL | 1 refills | Status: AC
Start: 2024-04-05 — End: ?

## 2024-04-05 MED ORDER — EMPAGLIFLOZIN 10 MG PO TABS
10.0000 mg | ORAL_TABLET | Freq: Every day | ORAL | 3 refills | Status: AC
Start: 2024-04-05 — End: ?

## 2024-04-05 NOTE — Progress Notes (Signed)
 Established Patient Office Visit  Subjective:     Patient ID: Walter Woods, male    DOB: [birth year], 48 y.o.   MRN: [id number]  Chief Complaint  Patient presents with   Follow-up    HPI  Discussed the use of AI scribe software for clinical note transcription with the patient, who gave verbal consent to proceed.  History of Present Illness Walter Woods is a 48 year old male with hypertension, GERD, and type 2 diabetes who presents for a three-month follow-up.  He is accompanied by his wife today  Glycemic control and antidiabetic medication effects - Type 2 diabetes managed with metformin ; insulin discontinued and shared with uninsured brother-in-law - Dry mouth and sensation of wet feet since starting metformin  - Constipation present - Dizziness, especially when climbing ladders, associated with perceived blood sugar or blood pressure changes - Energy levels fluctuate, with periods of fatigue and bursts of energy - [employer] improves energy without causing blood sugar crashes  Hypertension and antihypertensive therapy - Hypertension managed with atenolol  and Lasix  - Blood pressure readings stable despite occasionally missing atenolol  doses - Declined to increase Lasix  dose due to concerns about increased urination interfering with work  Respiratory symptoms - Dry cough with a popping sound began two weeks ago - No sputum production - Possible exposure to a musty environment preceding onset of cough  Gastrointestinal symptoms - Gastroesophageal reflux disease (GERD)  Psychosocial stressors and sleep - Significant stress due to family issues, including a brother with diabetes and another family member with cancer - Works third shift - Reports adequate sleep     ROS Per HPI      Objective:    BP 132/80 (BP Location: Left Arm, Patient Position: Sitting)   Pulse 66   Temp 98.3 F (36.8 C) (Temporal)   Ht 5' 9 (1.753 m)   Wt 294 lb 3.2 oz  (133.4 kg)   SpO2 93%   BMI 43.45 kg/m    Physical Exam Vitals and nursing note reviewed.  Constitutional:      General: He is not in acute distress.    Appearance: He is obese.  HENT:     Head: Normocephalic and atraumatic.     Right Ear: External ear normal.     Left Ear: External ear normal.     Nose: Nose normal.     Mouth/Throat:     Mouth: Mucous membranes are moist.     Pharynx: Oropharynx is clear.  Eyes:     Extraocular Movements: Extraocular movements intact.  Cardiovascular:     Rate and Rhythm: Normal rate and regular rhythm.     Pulses: Normal pulses.     Heart sounds: Normal heart sounds.  Pulmonary:     Effort: Pulmonary effort is normal. No respiratory distress.     Breath sounds: Wheezing present. No rhonchi or rales.     Comments: Dry cough in office Musculoskeletal:        General: Normal range of motion.     Cervical back: Normal range of motion.     Right lower leg: Edema (2+ non pitting) present.     Left lower leg: Edema (2 + non pitting) present.  Lymphadenopathy:     Cervical: No cervical adenopathy.  Skin:    General: Skin is warm and dry.  Neurological:     General: No focal deficit present.     Mental Status: He is alert and oriented to person, place, and time.  Psychiatric:        Mood and Affect: Mood normal.        Behavior: Behavior normal.     Results for orders placed or performed in visit on 04/05/24  CBC with Differential/Platelet  Result Value Ref Range   WBC 13.8 (H) 4.0 - 10.5 K/uL   RBC 4.87 4.22 - 5.81 Mil/uL   Hemoglobin 15.1 13.0 - 17.0 g/dL   HCT 54.9 60.9 - 47.9 %   MCV 92.5 78.0 - 100.0 fl   MCHC 33.6 30.0 - 36.0 g/dL   RDW 86.0 88.4 - 84.4 %   Platelets 323.0 150.0 - 400.0 K/uL   Neutrophils Relative % 70.4 43.0 - 77.0 %   Lymphocytes Relative 18.0 12.0 - 46.0 %   Monocytes Relative 8.9 3.0 - 12.0 %   Eosinophils Relative 2.3 0.0 - 5.0 %   Basophils Relative 0.4 0.0 - 3.0 %   Neutro Abs 9.7 (H) 1.4 - 7.7  K/uL   Lymphs Abs 2.5 0.7 - 4.0 K/uL   Monocytes Absolute 1.2 (H) 0.1 - 1.0 K/uL   Eosinophils Absolute 0.3 0.0 - 0.7 K/uL   Basophils Absolute 0.1 0.0 - 0.1 K/uL  Comprehensive metabolic panel with GFR  Result Value Ref Range   Sodium 137 135 - 145 mEq/L   Potassium 3.9 3.5 - 5.1 mEq/L   Chloride 100 96 - 112 mEq/L   CO2 27 19 - 32 mEq/L   Glucose, Bld 76 70 - 99 mg/dL   BUN 10 6 - 23 mg/dL   Creatinine, Ser 8.95 0.40 - 1.50 mg/dL   Total Bilirubin 0.7 0.2 - 1.2 mg/dL   Alkaline Phosphatase 78 39 - 117 U/L   AST 28 0 - 37 U/L   ALT 44 0 - 53 U/L   Total Protein 6.9 6.0 - 8.3 g/dL   Albumin 4.1 3.5 - 5.2 g/dL   GFR 15.19 >39.99 mL/min   Calcium  9.4 8.4 - 10.5 mg/dL  Lipid panel  Result Value Ref Range   Cholesterol 122 0 - 200 mg/dL   Triglycerides 875.9 0.0 - 149.0 mg/dL   HDL 73.09 (L) >60.99 mg/dL   VLDL 75.1 0.0 - 59.9 mg/dL   LDL Cholesterol 70 0 - 99 mg/dL   Total CHOL/HDL Ratio 5    NonHDL 95.02   Hemoglobin A1c  Result Value Ref Range   Hgb A1c MFr Bld 7.0 (H) 4.6 - 6.5 %  TSH  Result Value Ref Range   TSH 2.43 0.35 - 5.50 uIU/mL  Vitamin B12  Result Value Ref Range   Vitamin B-12 213 211 - 911 pg/mL  VITAMIN D  25 Hydroxy (Vit-D Deficiency, Fractures)  Result Value Ref Range   VITD 11.17 (L) 30.00 - 100.00 ng/mL    BP Readings from Last 3 Encounters:  04/05/24 132/80  03/07/24 128/68  01/15/24 (!) 140/82   Wt Readings from Last 3 Encounters:  04/05/24 294 lb 3.2 oz (133.4 kg)  03/07/24 290 lb (131.5 kg)  01/15/24 299 lb (135.6 kg)      Last CBC Lab Results  Component Value Date   WBC 13.8 (H) 04/05/2024   HGB 15.1 04/05/2024   HCT 45.0 04/05/2024   MCV 92.5 04/05/2024   MCH 31.0 06/30/2015   RDW 13.9 04/05/2024   PLT 323.0 04/05/2024   Last metabolic panel Lab Results  Component Value Date   GLUCOSE 76 04/05/2024   NA 137 04/05/2024   K 3.9 04/05/2024   CL 100 04/05/2024  CO2 27 04/05/2024   BUN 10 04/05/2024   CREATININE 1.04  04/05/2024   GFR 84.80 04/05/2024   CALCIUM  9.4 04/05/2024   PROT 6.9 04/05/2024   ALBUMIN 4.1 04/05/2024   BILITOT 0.7 04/05/2024   ALKPHOS 78 04/05/2024   AST 28 04/05/2024   ALT 44 04/05/2024   ANIONGAP 9 06/30/2015   Last lipids Lab Results  Component Value Date   CHOL 122 04/05/2024   HDL 26.90 (L) 04/05/2024   LDLCALC 70 04/05/2024   TRIG 124.0 04/05/2024   CHOLHDL 5 04/05/2024   Last hemoglobin A1c Lab Results  Component Value Date   HGBA1C 7.0 (H) 04/05/2024         Assessment & Plan:   Assessment and Plan Assessment & Plan Primary hypertension Hypertension management complicated by non-adherence to atenolol  due to forgetfulness. Blood pressure remains controlled despite missed doses, indicating residual medication effects. - Encouraged adherence to atenolol  regimen.  Type 2 diabetes mellitus with hyperglycemia Hyperglycemia management complicated by metformin  side effects. Insurance requires trial of Jardiance and pioglitazone. Dizziness and energy fluctuations may relate to blood glucose levels. Shared medications due to financial constraints. - Prescribe Jardiance 10 mg. -Prescribed pioglitazone 15 mg once daily - Order labs to assess blood glucose control and electrolytes. - Consider alternative diabetes medications if current regimen is not tolerated.  Gastroesophageal reflux disease with esophagitis - Stable, continue omeprazole   Acute cough, wheezing, moderate persistent asthma with acute exacerbation Recent dry cough with popping sound, possibly mold-related. Lower oxygen saturation than normal for him today differential includes pneumonia or wheezing. - Order chest x-ray to rule out pneumonia. - Refilled albuterol  inhaler -Had episode of dizziness yesterday while on a ladder, concerned this is related to his breathing - Administer antibiotics if indicated by x-ray results.  Mixed hyperlipidemia - Lipids today, will dose adjust medication as  needed  Vitamin D  deficiency - Vitamin D  levels today  Medication management - Labs today will dose adjust as needed  Immunization due Discussion of pneumonia and influenza vaccinations. Decision to delay until ruling out acute respiratory issues. - Administer influenza vaccines if chest x-ray is clear. - X-ray is normal     Orders Placed This Encounter  Procedures   DG Chest 2 View    Standing Status:   Future    Number of Occurrences:   1    Expiration Date:   10/04/2024    Reason for Exam (SYMPTOM  OR DIAGNOSIS REQUIRED):   cough, SOB    Preferred imaging location?:   [place]   Flu vaccine trivalent PF, 6mos and older(Flulaval,Afluria,Fluarix,Fluzone)   CBC with Differential/Platelet    Release to patient:   Immediate [1]   Comprehensive metabolic panel with GFR    Release to patient:   Immediate [1]   Lipid panel   Hemoglobin A1c   TSH   Vitamin B12   VITAMIN D  25 Hydroxy (Vit-D Deficiency, Fractures)     Meds ordered this encounter  Medications   empagliflozin (JARDIANCE) 10 MG TABS tablet    Sig: Take 1 tablet (10 mg total) by mouth daily.    Dispense:  90 tablet    Refill:  3   pioglitazone (ACTOS) 15 MG tablet    Sig: Take 1 tablet (15 mg total) by mouth daily.    Dispense:  30 tablet    Refill:  1   albuterol  (VENTOLIN  HFA) 108 (90 Base) MCG/ACT inhaler    Sig: Inhale 2 puffs into the lungs every  6 (six) hours as needed for wheezing or shortness of breath.    Dispense:  8 g    Refill:  3    Return in about 3 months (around 07/06/2024).  Corean LITTIE Ku, FNP

## 2024-04-05 NOTE — Patient Instructions (Addendum)
 STOP metformin   START Jardiance 10mg  once daily  START pioglitazone 15mg  once daily  Continue other current medications.   We have given your flu vaccine today.   I have sent in an albuterol  inhaler for you to use 2 puffs every 4 hours as needed for wheezing.  Chest xray was normal.   We are checking labs today, will be in contact with any results that require further attention  Follow up with me in about 3 months for labs and medication management, sooner if needed.

## 2024-04-06 DIAGNOSIS — Z23 Encounter for immunization: Secondary | ICD-10-CM

## 2024-04-06 DIAGNOSIS — R051 Acute cough: Secondary | ICD-10-CM | POA: Insufficient documentation

## 2024-04-06 DIAGNOSIS — E1165 Type 2 diabetes mellitus with hyperglycemia: Secondary | ICD-10-CM | POA: Insufficient documentation

## 2024-04-06 DIAGNOSIS — R42 Dizziness and giddiness: Secondary | ICD-10-CM | POA: Insufficient documentation

## 2024-04-06 DIAGNOSIS — R062 Wheezing: Secondary | ICD-10-CM | POA: Insufficient documentation

## 2024-04-06 HISTORY — DX: Type 2 diabetes mellitus with hyperglycemia: E11.65

## 2024-04-06 HISTORY — DX: Encounter for immunization: Z23

## 2024-04-06 HISTORY — DX: Wheezing: R06.2

## 2024-04-07 ENCOUNTER — Ambulatory Visit: Payer: Self-pay | Admitting: Family Medicine

## 2024-04-07 DIAGNOSIS — K047 Periapical abscess without sinus: Secondary | ICD-10-CM

## 2024-04-11 DIAGNOSIS — K047 Periapical abscess without sinus: Secondary | ICD-10-CM | POA: Insufficient documentation

## 2024-04-11 HISTORY — DX: Periapical abscess without sinus: K04.7

## 2024-04-11 MED ORDER — AMOXICILLIN-POT CLAVULANATE 875-125 MG PO TABS: 1.0000 | ORAL_TABLET | Freq: Two times a day (BID) | ORAL | 0 refills | Status: DC

## 2024-04-19 ENCOUNTER — Telehealth: Payer: Self-pay

## 2024-04-19 NOTE — Telephone Encounter (Signed)
 Copied from CRM [id number]. Topic: Clinical - Medication Question >> Apr 19, 2024  3:35 PM Walter Woods wrote: Reason for CRM: Patients wife wants to confirm provider removed metformin  from his medication.    She would also like to know if he can take her 25 mg (JARDIANCE) since she couldn't take it due to stomach issue.   [name] Wife [phone]

## 2024-04-22 NOTE — Telephone Encounter (Signed)
 Spoke with patient and his wife. She states he does not get paid until next week, I let her know I have put samples upfront for patient. He states he will come by and get them tomorrow.  Advised her he should likely do 10 mg of Jardiance as prescribed by PCP, will send message to PCP to verify. Both patient and his wife give verbal understanding

## 2024-04-23 ENCOUNTER — Other Ambulatory Visit (HOSPITAL_COMMUNITY): Payer: Self-pay

## 2024-04-24 ENCOUNTER — Other Ambulatory Visit (HOSPITAL_COMMUNITY): Payer: Self-pay

## 2024-04-25 ENCOUNTER — Other Ambulatory Visit (HOSPITAL_COMMUNITY): Payer: Self-pay

## 2024-04-30 ENCOUNTER — Other Ambulatory Visit (HOSPITAL_COMMUNITY): Payer: Self-pay

## 2024-06-06 ENCOUNTER — Ambulatory Visit

## 2024-06-12 ENCOUNTER — Ambulatory Visit

## 2024-06-14 ENCOUNTER — Ambulatory Visit

## 2024-06-14 VITALS — BP 150/88 | HR 70 | Ht 69.0 in | Wt 281.8 lb

## 2024-06-14 DIAGNOSIS — R600 Localized edema: Secondary | ICD-10-CM

## 2024-06-14 DIAGNOSIS — I10 Essential (primary) hypertension: Secondary | ICD-10-CM | POA: Diagnosis not present

## 2024-06-14 DIAGNOSIS — E782 Mixed hyperlipidemia: Secondary | ICD-10-CM | POA: Diagnosis not present

## 2024-06-14 MED ORDER — ATENOLOL 25 MG PO TABS: 25.0000 mg | ORAL_TABLET | Freq: Every day | ORAL | 3 refills | Status: AC

## 2024-06-14 MED ORDER — AMLODIPINE BESYLATE 5 MG PO TABS: 5.0000 mg | ORAL_TABLET | Freq: Every day | ORAL | 3 refills | Status: DC

## 2024-06-14 NOTE — Assessment & Plan Note (Signed)
 Lipid panel 04/05/2024 total Vaslow 122, HDL 26, LDL 70, triglycerides 124. Optimal. Continue atorvastatin  40 mg once daily.

## 2024-06-14 NOTE — Assessment & Plan Note (Signed)
 Target blood pressure below 130/80 mmHg.  Taper down dose of atenolol  to 25 mg once daily given his concerns about erectile dysfunction. Continue losartan -hydrochlorothiazide 100-25 mg once daily. Add amlodipine 5 mg once daily, discussed with him potential side effects including pedal edema.

## 2024-06-14 NOTE — Assessment & Plan Note (Signed)
 Improved with ongoing weight loss, salt restriction in the diet. No obvious signs of heart failure. Prior evaluation with BNP and transthoracic echocardiogram were unremarkable.  Continue with blood pressure control salt restriction and diet modifications to target weight loss.

## 2024-06-14 NOTE — Patient Instructions (Signed)
 Medication Instructions:  Your physician has recommended you make the following change in your medication:   START: Amlodipine 5 mg daily START: Atenolol  25 mg daily  *If you need a refill on your cardiac medications before your next appointment, please call your pharmacy*  Lab Work: None If you have labs (blood work) drawn today and your tests are completely normal, you will receive your results only by: MyChart Message (if you have MyChart) OR A paper copy in the mail If you have any lab test that is abnormal or we need to change your treatment, we will call you to review the results.  Testing/Procedures: None  Follow-Up: At [facility], you and your health needs are our priority.  As part of our continuing mission to provide you with exceptional heart care, our providers are all part of one team.  This team includes your primary Cardiologist (physician) and Advanced Practice Providers or APPs (Physician Assistants and Nurse Practitioners) who all work together to provide you with the care you need, when you need it.  Your next appointment:   6 month(s)  Provider:   Alean Kobus, MD    We recommend signing up for the patient portal called MyChart.  Sign up information is provided on this After Visit Summary.  MyChart is used to connect with patients for Virtual Visits (Telemedicine).  Patients are able to view lab/test results, encounter notes, upcoming appointments, etc.  Non-urgent messages can be sent to your provider as well.   To learn more about what you can do with MyChart, go to [email or web address].   Other Instructions None

## 2024-06-14 NOTE — Progress Notes (Signed)
 Cardiology Consultation:    Date:  06/14/2024   ID:  [name], DOB [birth year], MRN [id number]  PCP:  Walter Corean CROME, FNP  Cardiologist:  Alean SAUNDERS Montford Barg, MD   Referring MD: Walter Corean CROME, FNP  30-month follow-up No chief complaint on file.    ASSESSMENT AND PLAN:   Mr. Walter Woods 48 year old male history of diabetes, hypertension, hyperlipidemia, morbid obesity, smokes tobacco [up to 4 packs a day has been cutting down recently <1 pack a day], rare social alcohol consumption.  Lexiscan stress test in 2023 for chest pain evaluation showed no ischemia.  Here for follow-up visit. Problem List Items Addressed This Visit       Cardiovascular and Mediastinum   Hypertension - Primary   Target blood pressure below 130/80 mmHg.  Taper down dose of atenolol  to 25 mg once daily given his concerns about erectile dysfunction. Continue losartan -hydrochlorothiazide 100-25 mg once daily. Add amlodipine 5 mg once daily, discussed with him potential side effects including pedal edema.        Relevant Medications   amLODipine (NORVASC) 5 MG tablet   atenolol  (TENORMIN ) 25 MG tablet     Other   Mixed hyperlipidemia   Lipid panel 04/05/2024 total Vaslow 122, HDL 26, LDL 70, triglycerides 124. Optimal. Continue atorvastatin  40 mg once daily.       Relevant Medications   amLODipine (NORVASC) 5 MG tablet   atenolol  (TENORMIN ) 25 MG tablet   Bilateral lower extremity edema   Improved with ongoing weight loss, salt restriction in the diet. No obvious signs of heart failure. Prior evaluation with BNP and transthoracic echocardiogram were unremarkable.  Continue with blood pressure control salt restriction and diet modifications to target weight loss.        Return to clinic tentatively in 6 months.  History of Present Illness:    Walter Woods is a 48 y.o. male who is being seen today for follow up visit. PCP is Walter Corean CROME, FNP. Last  visit with me in the office was 03/07/2024.  Pleasant man here for the visit by himself.  For part of the visit his daughter Walter Woods was present on speaker phone. 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  Has history of diabetes, hypertension, hyperlipidemia, morbid obesity, smokes tobacco [up to 4 packs a day has been cutting down recently <1 pack a day], rare social alcohol consumption.  Lexiscan stress test in 2023 for chest pain evaluation showed no ischemia.  Evaluated for bilateral lower extremity edema, proBNP was normal in July 2025, echocardiogram 02/07/2024 noted normal biventricular function with LVEF 60 to 65%, mild concentric LVH, normal diastolic parameters and no significant valve abnormalities.  Mentions overall he has been doing well.  No significant functional limitations. Able to do all his activities at home and at work. Denies any symptoms of chest pain, shortness of breath, orthopnea paroxysmal nocturnal dyspnea. No palpitation, lightheadedness, dizziness or syncopal episodes.  Mentions his diet has been significantly adjusted and he has been losing weight, down to 281 pounds in comparison to 290 pounds at last office visit.  Mentions blood pressures at home fluctuate between 130s to 150s. Taking his medications consistently.  Also mentions concerns about erections lasting for shorter time during during sexual activity and was wondering if any of his medications is contributing to this.  Denies any decrease in libido or having erection.  Mentions pedal edema has significantly improved.  Has cut down smoking to less than  1 pack a day. rare social alcohol consumption.  Past Medical History:  Diagnosis Date   Asthma    Bilateral lower extremity edema 01/15/2024   Dental infection 04/11/2024   Febrile seizures (HCC)    when child   Gastroesophageal reflux disease with esophagitis without hemorrhage 10/11/2023   GERD (gastroesophageal reflux  disease)    History of diverticulitis    History of MI (myocardial infarction) 09/08/2023   Hyperglycemia 09/08/2023   Hypertension    Leg swelling 01/16/2024   Leukocytosis 08/06/2019   Medication management 10/11/2023   Mild intermittent asthma without complication 09/08/2023   Mixed hyperlipidemia 09/08/2023   Myocardial infarction (HCC)    Need for immunization against influenza 04/06/2024   Pain and swelling of lower leg 09/08/2023   Prediabetes 01/11/2024   Primary hypertension 09/08/2023   Tobacco abuse counseling 01/16/2024   Type 2 diabetes mellitus with hyperglycemia, without long-term current use of insulin (HCC) 04/06/2024   Vitamin D  deficiency 01/11/2024   Weight gain 09/08/2023   Wheezing 04/06/2024    Past Surgical History:  Procedure Laterality Date   TESTICLE SURGERY Right    cyst removed     Current Medications: Active Medications[1]   Allergies:   Phenobarbital   Social History   Socioeconomic History   Marital status: Married    Spouse name: Not on file   Number of children: 2   Years of education: Not on file   Highest education level: Not on file  Occupational History   Not on file  Tobacco Use   Smoking status: Every Day    Current packs/day: 2.00    Average packs/day: 2.0 packs/day for 34.9 years (69.9 ttl pk-yrs)    Types: Cigarettes    Start date: 07/04/1989   Smokeless tobacco: Never   Tobacco comments:    down to 1/4 ppd  Vaping Use   Vaping status: Never Used  Substance and Sexual Activity   Alcohol use: Yes    Alcohol/week: 3.0 standard drinks of alcohol    Types: 3 Cans of beer per week   Drug use: Never   Sexual activity: Not on file  Other Topics Concern   Not on file  Social History Narrative   Not on file   Social Drivers of Health   Tobacco Use: High Risk (06/14/2024)   Patient History    Smoking Tobacco Use: Every Day    Smokeless Tobacco Use: Never    Passive Exposure: Not on file  Financial Resource Strain:  Not on file  Food Insecurity: Not on file  Transportation Needs: Not on file  Physical Activity: Not on file  Stress: Not on file  Social Connections: Not on file  Depression (PHQ2-9): Low Risk (01/12/2024)   Depression (PHQ2-9)    PHQ-2 Score: 0  Alcohol Screen: Not on file  Housing: Not on file  Utilities: Not on file  Health Literacy: Not on file     Family History: The patient's family history includes Anemia in his mother; Cervical cancer in his maternal grandmother; Thyroid  disease in his mother. ROS:   Please see the history of present illness.    All 14 point review of systems negative except as described per history of present illness.  EKGs/Labs/Other Studies Reviewed:    The following studies were reviewed today:   EKG:       Recent Labs: 01/12/2024: Pro B Natriuretic peptide (BNP) 76.0 03/08/2024: Magnesium  2.1 04/05/2024: ALT 44; BUN 10; Creatinine, Ser 1.04; Hemoglobin 15.1; Platelets 323.0;  Potassium 3.9; Sodium 137; TSH 2.43  Recent Lipid Panel    Component Value Date/Time   CHOL 122 04/05/2024 1214   TRIG 124.0 04/05/2024 1214   HDL 26.90 (L) 04/05/2024 1214   CHOLHDL 5 04/05/2024 1214   VLDL 24.8 04/05/2024 1214   LDLCALC 70 04/05/2024 1214    Physical Exam:    VS:  BP (!) 150/88   Pulse 70   Ht 5' 9 (1.753 m)   Wt 281 lb 12.8 oz (127.8 kg)   SpO2 95%   BMI 41.61 kg/m     Wt Readings from Last 3 Encounters:  06/14/24 281 lb 12.8 oz (127.8 kg)  04/05/24 294 lb 3.2 oz (133.4 kg)  03/07/24 290 lb (131.5 kg)     GENERAL:  Well nourished, well developed in no acute distress NECK: No JVD; No carotid bruits CARDIAC: RRR, S1 and S2 present, no murmurs, no rubs, no gallops CHEST:  Clear to auscultation without rales, wheezing or rhonchi  Extremities: No pitting pedal edema. Pulses bilaterally symmetric with radial 2+ and dorsalis pedis 2+ NEUROLOGIC:  Alert and oriented x 3  Medication Adjustments/Labs and Tests Ordered: Current medicines are  reviewed at length with the patient today.  Concerns regarding medicines are outlined above.  No orders of the defined types were placed in this encounter.  Meds ordered this encounter  Medications   amLODipine (NORVASC) 5 MG tablet    Sig: Take 1 tablet (5 mg total) by mouth daily.    Dispense:  90 tablet    Refill:  3   atenolol  (TENORMIN ) 25 MG tablet    Sig: Take 1 tablet (25 mg total) by mouth daily.    Dispense:  90 tablet    Refill:  3    Signed, Zerrick Hanssen reddy Julio Storr, MD, MPH, [facility]. 06/14/2024 9:30 AM    [facility]     [1]  Current Meds  Medication Sig   albuterol  (VENTOLIN  HFA) 108 (90 Base) MCG/ACT inhaler Inhale 2 puffs into the lungs every 6 (six) hours as needed for wheezing or shortness of breath.   amLODipine (NORVASC) 5 MG tablet Take 1 tablet (5 mg total) by mouth daily.   atenolol  (TENORMIN ) 25 MG tablet Take 1 tablet (25 mg total) by mouth daily.   atorvastatin  (LIPITOR) 40 MG tablet Take 1 tablet (40 mg total) by mouth daily.   Blood Glucose Monitoring Suppl DEVI 1 each by Does not apply route in the morning, at noon, and at bedtime. May substitute to any manufacturer covered by patient's insurance.   empagliflozin  (JARDIANCE ) 10 MG TABS tablet Take 1 tablet (10 mg total) by mouth daily.   furosemide  (LASIX ) 40 MG tablet Take 1 tablet (40 mg total) by mouth daily. Take an additional 40 mg every other afternoon.   Glucose Blood (BLOOD GLUCOSE TEST STRIPS) STRP 1 each by In Vitro route 2 (two) times daily. May substitute to any manufacturer covered by patient's insurance.   ibuprofen  (ADVIL ,MOTRIN ) 800 MG tablet Take 1 tablet (800 mg total) by mouth 3 (three) times daily.   losartan -hydrochlorothiazide (HYZAAR) 100-25 MG tablet Take 1 tablet by mouth daily.   omeprazole  (PRILOSEC  OTC) 20 MG tablet Take 1 tablet (20 mg total) by mouth daily.   pioglitazone  (ACTOS ) 15 MG tablet Take 1 tablet (15 mg total) by mouth daily.   Vitamin D ,  Ergocalciferol , (DRISDOL ) 1.25 MG (50000 UNIT) CAPS capsule Take 1 capsule (50,000 Units total) by mouth every 7 (seven) days.   [  DISCONTINUED] atenolol  (TENORMIN ) 50 MG tablet Take 1 tablet (50 mg total) by mouth daily.

## 2024-06-24 ENCOUNTER — Telehealth: Admitting: Physician Assistant

## 2024-06-24 DIAGNOSIS — R6889 Other general symptoms and signs: Secondary | ICD-10-CM

## 2024-06-24 DIAGNOSIS — R071 Chest pain on breathing: Secondary | ICD-10-CM

## 2024-06-24 NOTE — Progress Notes (Signed)
" °  Because of shortness of breath and painful breathing, I feel your condition warrants further evaluation and I recommend that you be seen in a face-to-face visit. You should be evaluated to rule out pneumonia.   NOTE: There will be NO CHARGE for this E-Visit   If you are having a true medical emergency, please call 911.     For an urgent face to face visit, [facility] has multiple urgent care centers for your convenience.  Click the link below for the full list of locations and hours, walk-in wait times, appointment scheduling options and driving directions:  Urgent Care - [place], [place], [place], [place], [place], [place]  [place]     Your MyChart E-visit questionnaire answers were reviewed by a board certified advanced clinical practitioner to complete your personal care plan based on your specific symptoms.    Thank you for using e-Visits.     "

## 2024-06-25 ENCOUNTER — Other Ambulatory Visit: Payer: Self-pay | Admitting: Family Medicine

## 2024-06-25 ENCOUNTER — Encounter: Payer: Self-pay | Admitting: Family Medicine

## 2024-07-12 ENCOUNTER — Ambulatory Visit: Admitting: Family Medicine

## 2024-07-12 NOTE — Progress Notes (Unsigned)
" ° °  Established Patient Office Visit  Subjective:     Patient ID: Walter Woods, male    DOB: [birth year], 49 y.o.   MRN: [id number]  No chief complaint on file.   HPI  Discussed the use of AI scribe software for clinical note transcription with the patient, who gave verbal consent to proceed.  History of Present Illness      ROS Per HPI      Objective:    There were no vitals taken for this visit.   Physical Exam Vitals and nursing note reviewed.  Constitutional:      General: He is not in acute distress.    Appearance: Normal appearance.  HENT:     Head: Normocephalic and atraumatic.     Right Ear: External ear normal.     Left Ear: External ear normal.     Nose: Nose normal.     Mouth/Throat:     Mouth: Mucous membranes are moist.     Pharynx: Oropharynx is clear.  Eyes:     Extraocular Movements: Extraocular movements intact.  Cardiovascular:     Rate and Rhythm: Normal rate and regular rhythm.     Pulses: Normal pulses.     Heart sounds: Normal heart sounds.  Pulmonary:     Effort: Pulmonary effort is normal. No respiratory distress.     Breath sounds: Normal breath sounds. No wheezing, rhonchi or rales.  Musculoskeletal:        General: Normal range of motion.     Cervical back: Normal range of motion.     Right lower leg: No edema.     Left lower leg: No edema.  Lymphadenopathy:     Cervical: No cervical adenopathy.  Skin:    General: Skin is warm and dry.  Neurological:     General: No focal deficit present.     Mental Status: He is alert and oriented to person, place, and time.  Psychiatric:        Mood and Affect: Mood normal.        Behavior: Behavior normal.     No results found for any visits on 07/12/24.  The ASCVD Risk score (Arnett DK, et al., 2019) failed to calculate for the following reasons:   Risk score cannot be calculated because patient has a medical history suggesting prior/existing ASCVD   * - Cholesterol units were  assumed  {Vitals History (Optional):23777}  {Show previous labs (optional):23779}     Assessment & Plan:   Assessment and Plan Assessment & Plan      No orders of the defined types were placed in this encounter.    No orders of the defined types were placed in this encounter.   No follow-ups on file.  Corean LITTIE Ku, FNP   "

## 2024-07-26 ENCOUNTER — Ambulatory Visit: Admitting: Family Medicine

## 2024-08-06 ENCOUNTER — Ambulatory Visit: Admitting: Family Medicine

## 2024-08-08 NOTE — Progress Notes (Unsigned)
" ° °  Established Patient Office Visit  Subjective:     Patient ID: Walter Woods, male    DOB: 09-08-1975, 49 y.o.   MRN: [id number]  No chief complaint on file.   HPI  Discussed the use of AI scribe software for clinical note transcription with the patient, who gave verbal consent to proceed.  History of Present Illness      ROS Per HPI      Objective:    There were no vitals taken for this visit.   Physical Exam Vitals and nursing note reviewed.  Constitutional:      General: He is not in acute distress.    Appearance: Normal appearance.  HENT:     Head: Normocephalic and atraumatic.     Right Ear: External ear normal.     Left Ear: External ear normal.     Nose: Nose normal.     Mouth/Throat:     Mouth: Mucous membranes are moist.     Pharynx: Oropharynx is clear.  Eyes:     Extraocular Movements: Extraocular movements intact.  Cardiovascular:     Rate and Rhythm: Normal rate and regular rhythm.     Pulses: Normal pulses.     Heart sounds: Normal heart sounds.  Pulmonary:     Effort: Pulmonary effort is normal. No respiratory distress.     Breath sounds: Normal breath sounds. No wheezing, rhonchi or rales.  Musculoskeletal:        General: Normal range of motion.     Cervical back: Normal range of motion.     Right lower leg: No edema.     Left lower leg: No edema.  Lymphadenopathy:     Cervical: No cervical adenopathy.  Skin:    General: Skin is warm and dry.  Neurological:     General: No focal deficit present.     Mental Status: He is alert and oriented to person, place, and time.  Psychiatric:        Mood and Affect: Mood normal.        Behavior: Behavior normal.     No results found for any visits on 08/09/24.  The ASCVD Risk score (Arnett DK, et al., 2019) failed to calculate for the following reasons:   Risk score cannot be calculated because patient has a medical history suggesting prior/existing ASCVD   * - Cholesterol units were  assumed  {Vitals History (Optional):23777}  {Show previous labs (optional):23779}     Assessment & Plan:   Assessment and Plan Assessment & Plan      No orders of the defined types were placed in this encounter.    No orders of the defined types were placed in this encounter.   No follow-ups on file.  Corean LITTIE Ku, FNP   "

## 2024-08-09 ENCOUNTER — Ambulatory Visit

## 2024-08-09 ENCOUNTER — Ambulatory Visit: Admitting: Family Medicine

## 2024-08-09 ENCOUNTER — Ambulatory Visit: Payer: Self-pay | Admitting: Family Medicine

## 2024-08-09 VITALS — BP 138/86 | HR 82 | Temp 98.5°F | Ht 69.0 in | Wt 284.8 lb

## 2024-08-09 DIAGNOSIS — K2100 Gastro-esophageal reflux disease with esophagitis, without bleeding: Secondary | ICD-10-CM

## 2024-08-09 DIAGNOSIS — E559 Vitamin D deficiency, unspecified: Secondary | ICD-10-CM

## 2024-08-09 DIAGNOSIS — E1165 Type 2 diabetes mellitus with hyperglycemia: Secondary | ICD-10-CM

## 2024-08-09 DIAGNOSIS — I10 Essential (primary) hypertension: Secondary | ICD-10-CM

## 2024-08-09 DIAGNOSIS — W19XXXA Unspecified fall, initial encounter: Secondary | ICD-10-CM

## 2024-08-09 DIAGNOSIS — M545 Low back pain, unspecified: Secondary | ICD-10-CM

## 2024-08-09 DIAGNOSIS — E782 Mixed hyperlipidemia: Secondary | ICD-10-CM

## 2024-08-09 DIAGNOSIS — R0989 Other specified symptoms and signs involving the circulatory and respiratory systems: Secondary | ICD-10-CM

## 2024-08-09 DIAGNOSIS — Z79899 Other long term (current) drug therapy: Secondary | ICD-10-CM

## 2024-08-09 LAB — CBC WITH DIFFERENTIAL/PLATELET
Basophils Absolute: 0.1 10*3/uL (ref 0.0–0.1)
Basophils Relative: 0.6 % (ref 0.0–3.0)
Eosinophils Absolute: 0.3 10*3/uL (ref 0.0–0.7)
Eosinophils Relative: 2.3 % (ref 0.0–5.0)
HCT: 50.4 % (ref 39.0–52.0)
Hemoglobin: 16.6 g/dL (ref 13.0–17.0)
Lymphocytes Relative: 18.4 % (ref 12.0–46.0)
Lymphs Abs: 2.6 10*3/uL (ref 0.7–4.0)
MCHC: 32.9 g/dL (ref 30.0–36.0)
MCV: 93 fl (ref 78.0–100.0)
Monocytes Absolute: 1.5 10*3/uL — ABNORMAL HIGH (ref 0.1–1.0)
Monocytes Relative: 10.4 % (ref 3.0–12.0)
Neutro Abs: 9.7 10*3/uL — ABNORMAL HIGH (ref 1.4–7.7)
Neutrophils Relative %: 68.3 % (ref 43.0–77.0)
Platelets: 367 10*3/uL (ref 150.0–400.0)
RBC: 5.41 Mil/uL (ref 4.22–5.81)
RDW: 14.5 % (ref 11.5–15.5)
WBC: 14.1 10*3/uL — ABNORMAL HIGH (ref 4.0–10.5)

## 2024-08-09 LAB — LIPID PANEL
Cholesterol: 120 mg/dL (ref 28–200)
HDL: 35.7 mg/dL — ABNORMAL LOW
LDL Cholesterol: 70 mg/dL (ref 10–99)
NonHDL: 84.27
Total CHOL/HDL Ratio: 3
Triglycerides: 73 mg/dL (ref 10.0–149.0)
VLDL: 14.6 mg/dL (ref 0.0–40.0)

## 2024-08-09 LAB — COMPREHENSIVE METABOLIC PANEL WITH GFR
ALT: 36 U/L (ref 3–53)
AST: 24 U/L (ref 5–37)
Albumin: 4.5 g/dL (ref 3.5–5.2)
Alkaline Phosphatase: 86 U/L (ref 39–117)
BUN: 11 mg/dL (ref 6–23)
CO2: 31 meq/L (ref 19–32)
Calcium: 9.8 mg/dL (ref 8.4–10.5)
Chloride: 98 meq/L (ref 96–112)
Creatinine, Ser: 0.95 mg/dL (ref 0.40–1.50)
GFR: 94.3 mL/min
Glucose, Bld: 92 mg/dL (ref 70–99)
Potassium: 4 meq/L (ref 3.5–5.1)
Sodium: 137 meq/L (ref 135–145)
Total Bilirubin: 0.7 mg/dL (ref 0.2–1.2)
Total Protein: 8.1 g/dL (ref 6.0–8.3)

## 2024-08-09 LAB — MICROALBUMIN / CREATININE URINE RATIO
Creatinine,U: 65.4 mg/dL
Microalb Creat Ratio: UNDETERMINED mg/g (ref 0.0–30.0)
Microalb, Ur: <0.7 mg/dL

## 2024-08-09 LAB — VITAMIN D 25 HYDROXY (VIT D DEFICIENCY, FRACTURES): VITD: 12.87 ng/mL — ABNORMAL LOW (ref 30.00–100.00)

## 2024-08-09 LAB — HEMOGLOBIN A1C: Hgb A1c MFr Bld: 6.6 % — ABNORMAL HIGH (ref 4.6–6.5)

## 2024-08-09 NOTE — Patient Instructions (Addendum)
 Continue current medication regimen.   Follow up with specialists as scheduled.   We are checking labs today, will be in contact with any results that require further attention.  We are getting an xray today. We will be in contact with any abnormal results that require further attention.  I have ordered an ultrasound of your neck to check on your carotid arteries.   Follow-up with me in 3 mos for medication management, sooner if needed.

## 2024-08-09 NOTE — Progress Notes (Signed)
 Addressed via MyChart.

## 2024-11-15 ENCOUNTER — Ambulatory Visit: Admitting: Family Medicine
